# Patient Record
Sex: Male | Born: 1935 | Race: White | Hispanic: No | Smoking: Former smoker
Health system: Southern US, Community
[De-identification: ages and names within clinical notes are randomized; demographics above are authoritative.]

## PROBLEM LIST (undated history)

## (undated) DIAGNOSIS — I10 Essential (primary) hypertension: Secondary | ICD-10-CM

## (undated) DIAGNOSIS — I639 Cerebral infarction, unspecified: Secondary | ICD-10-CM

## (undated) DIAGNOSIS — D509 Iron deficiency anemia, unspecified: Secondary | ICD-10-CM

## (undated) DIAGNOSIS — R1310 Dysphagia, unspecified: Secondary | ICD-10-CM

## (undated) DIAGNOSIS — M6281 Muscle weakness (generalized): Secondary | ICD-10-CM

## (undated) DIAGNOSIS — E7849 Other hyperlipidemia: Secondary | ICD-10-CM

## (undated) HISTORY — PX: PACEMAKER INSERTION: SHX728

## (undated) HISTORY — PX: ESOPHAGEAL DILATION: SHX303

## (undated) HISTORY — PX: HERNIA REPAIR: SHX51

## (undated) HISTORY — PX: CHOLECYSTECTOMY: SHX55

---

## 2004-08-10 ENCOUNTER — Emergency Department: Payer: Self-pay | Admitting: Unknown Physician Specialty

## 2005-02-07 ENCOUNTER — Emergency Department: Payer: Self-pay | Admitting: Emergency Medicine

## 2005-02-08 ENCOUNTER — Other Ambulatory Visit: Payer: Self-pay

## 2005-02-12 ENCOUNTER — Inpatient Hospital Stay: Payer: Self-pay | Admitting: General Surgery

## 2005-04-10 ENCOUNTER — Inpatient Hospital Stay: Payer: Self-pay | Admitting: Otolaryngology

## 2005-04-10 ENCOUNTER — Other Ambulatory Visit: Payer: Self-pay

## 2005-08-20 ENCOUNTER — Ambulatory Visit: Payer: Self-pay | Admitting: General Practice

## 2005-12-01 ENCOUNTER — Ambulatory Visit: Payer: Self-pay | Admitting: Internal Medicine

## 2005-12-29 ENCOUNTER — Ambulatory Visit: Payer: Self-pay | Admitting: General Practice

## 2006-07-12 ENCOUNTER — Ambulatory Visit: Payer: Self-pay | Admitting: Internal Medicine

## 2006-07-15 ENCOUNTER — Ambulatory Visit: Payer: Self-pay | Admitting: Internal Medicine

## 2006-10-13 ENCOUNTER — Ambulatory Visit: Payer: Self-pay | Admitting: Internal Medicine

## 2006-10-18 ENCOUNTER — Ambulatory Visit: Payer: Self-pay | Admitting: Internal Medicine

## 2006-11-13 ENCOUNTER — Ambulatory Visit: Payer: Self-pay | Admitting: Internal Medicine

## 2007-03-13 ENCOUNTER — Ambulatory Visit: Payer: Self-pay | Admitting: Ophthalmology

## 2007-03-21 ENCOUNTER — Ambulatory Visit: Payer: Self-pay | Admitting: Ophthalmology

## 2007-04-14 ENCOUNTER — Ambulatory Visit: Payer: Self-pay | Admitting: Internal Medicine

## 2008-11-04 ENCOUNTER — Ambulatory Visit: Payer: Self-pay | Admitting: Internal Medicine

## 2009-05-20 ENCOUNTER — Ambulatory Visit: Payer: Self-pay | Admitting: Gastroenterology

## 2009-09-01 ENCOUNTER — Ambulatory Visit: Payer: Self-pay | Admitting: Internal Medicine

## 2011-02-16 ENCOUNTER — Emergency Department: Payer: Self-pay | Admitting: *Deleted

## 2011-04-28 ENCOUNTER — Ambulatory Visit: Payer: Self-pay | Admitting: Urology

## 2012-04-24 ENCOUNTER — Ambulatory Visit: Payer: Self-pay | Admitting: Gastroenterology

## 2012-05-23 ENCOUNTER — Ambulatory Visit: Payer: Self-pay | Admitting: Gastroenterology

## 2012-06-12 ENCOUNTER — Ambulatory Visit: Payer: Self-pay | Admitting: Gastroenterology

## 2012-09-27 ENCOUNTER — Observation Stay: Payer: Self-pay | Admitting: Internal Medicine

## 2012-09-27 LAB — BASIC METABOLIC PANEL
Anion Gap: 7 (ref 7–16)
Calcium, Total: 8.6 mg/dL (ref 8.5–10.1)
Chloride: 107 mmol/L (ref 98–107)
EGFR (Non-African Amer.): >60
Potassium: 4 mmol/L (ref 3.5–5.1)
Sodium: 140 mmol/L (ref 136–145)

## 2012-09-27 LAB — CBC
HCT: 42.3 % (ref 40.0–52.0)
MCH: 30.9 pg (ref 26.0–34.0)
MCV: 92 fL (ref 80–100)
Platelet: 265 10*3/uL (ref 150–440)
RBC: 4.61 10*6/uL (ref 4.40–5.90)
RDW: 12.8 % (ref 11.5–14.5)
WBC: 6.8 10*3/uL (ref 3.8–10.6)

## 2012-09-27 LAB — TROPONIN I: Troponin-I: 0.04 ng/mL

## 2012-09-27 LAB — CK TOTAL AND CKMB (NOT AT ARMC)
CK, Total: 125 U/L (ref 35–232)
CK-MB: 2.9 ng/mL (ref 0.5–3.6)

## 2012-09-28 LAB — CBC WITH DIFFERENTIAL/PLATELET
Basophil #: 0 10*3/uL (ref 0.0–0.1)
Basophil %: 0.5 %
Eosinophil #: 0.2 10*3/uL (ref 0.0–0.7)
Lymphocyte #: 2.4 10*3/uL (ref 1.0–3.6)
MCH: 31.4 pg (ref 26.0–34.0)
MCHC: 34.4 g/dL (ref 32.0–36.0)
Monocyte %: 7.8 %
Neutrophil #: 4.1 10*3/uL (ref 1.4–6.5)
Neutrophil %: 56.3 %
Platelet: 239 10*3/uL (ref 150–440)
RBC: 4.46 10*6/uL (ref 4.40–5.90)
RDW: 13.3 % (ref 11.5–14.5)

## 2012-09-28 LAB — BASIC METABOLIC PANEL
Anion Gap: 6 — ABNORMAL LOW (ref 7–16)
BUN: 12 mg/dL (ref 7–18)
Chloride: 109 mmol/L — ABNORMAL HIGH (ref 98–107)
Co2: 27 mmol/L (ref 21–32)
EGFR (African American): >60
Glucose: 93 mg/dL (ref 65–99)
Osmolality: 283 (ref 275–301)
Potassium: 4 mmol/L (ref 3.5–5.1)
Sodium: 142 mmol/L (ref 136–145)

## 2012-09-28 LAB — LIPID PANEL
Cholesterol: 111 mg/dL (ref 0–200)
HDL Cholesterol: 29 mg/dL — ABNORMAL LOW (ref 40–60)
Ldl Cholesterol, Calc: 54 mg/dL (ref 0–100)
Triglycerides: 141 mg/dL (ref 0–200)

## 2012-09-28 LAB — TROPONIN I: Troponin-I: 0.05 ng/mL

## 2012-09-28 LAB — PROTIME-INR
INR: 1
Prothrombin Time: 13.4 secs (ref 11.5–14.7)

## 2012-12-28 ENCOUNTER — Observation Stay: Payer: Self-pay | Admitting: Internal Medicine

## 2012-12-28 LAB — CBC
HCT: 42.3 % (ref 40.0–52.0)
MCH: 31.1 pg (ref 26.0–34.0)
MCHC: 34.3 g/dL (ref 32.0–36.0)
RBC: 4.66 10*6/uL (ref 4.40–5.90)
RDW: 12.8 % (ref 11.5–14.5)
WBC: 8.9 10*3/uL (ref 3.8–10.6)

## 2012-12-28 LAB — URINALYSIS, COMPLETE
Bacteria: NONE SEEN
Bilirubin,UR: NEGATIVE
Blood: NEGATIVE
Glucose,UR: NEGATIVE mg/dL (ref 0–75)
Leukocyte Esterase: NEGATIVE
Nitrite: NEGATIVE
Ph: 6 (ref 4.5–8.0)
Protein: NEGATIVE
RBC,UR: <1 /HPF (ref 0–5)
Squamous Epithelial: NONE SEEN
WBC UR: <1 /HPF (ref 0–5)

## 2012-12-28 LAB — CK TOTAL AND CKMB (NOT AT ARMC)
CK, Total: 136 U/L (ref 35–232)
CK-MB: 2.6 ng/mL (ref 0.5–3.6)

## 2012-12-28 LAB — BASIC METABOLIC PANEL
Anion Gap: 7 (ref 7–16)
Calcium, Total: 9.1 mg/dL (ref 8.5–10.1)
Chloride: 106 mmol/L (ref 98–107)
Co2: 27 mmol/L (ref 21–32)
Creatinine: 1.09 mg/dL (ref 0.60–1.30)
Osmolality: 280 (ref 275–301)
Potassium: 4.1 mmol/L (ref 3.5–5.1)
Sodium: 140 mmol/L (ref 136–145)

## 2012-12-28 LAB — TROPONIN I: Troponin-I: 0.03 ng/mL

## 2012-12-29 LAB — CBC WITH DIFFERENTIAL/PLATELET
Eosinophil %: 4.6 %
HCT: 40 % (ref 40.0–52.0)
HGB: 13.8 g/dL (ref 13.0–18.0)
Lymphocyte #: 2.3 10*3/uL (ref 1.0–3.6)
Lymphocyte %: 31.8 %
MCH: 31.3 pg (ref 26.0–34.0)
MCHC: 34.5 g/dL (ref 32.0–36.0)
Monocyte #: 0.5 x10 3/mm (ref 0.2–1.0)
Neutrophil #: 4 10*3/uL (ref 1.4–6.5)
Neutrophil %: 55.3 %
WBC: 7.2 10*3/uL (ref 3.8–10.6)

## 2012-12-29 LAB — CK TOTAL AND CKMB (NOT AT ARMC)
CK, Total: 93 U/L (ref 35–232)
CK-MB: 2.6 ng/mL (ref 0.5–3.6)
CK-MB: 2.4 ng/mL (ref 0.5–3.6)

## 2012-12-29 LAB — BASIC METABOLIC PANEL
Anion Gap: 5 — ABNORMAL LOW (ref 7–16)
BUN: 11 mg/dL (ref 7–18)
Chloride: 108 mmol/L — ABNORMAL HIGH (ref 98–107)
Creatinine: 0.95 mg/dL (ref 0.60–1.30)
EGFR (Non-African Amer.): >60
Glucose: 92 mg/dL (ref 65–99)
Sodium: 141 mmol/L (ref 136–145)

## 2012-12-29 LAB — LIPID PANEL
HDL Cholesterol: 29 mg/dL — ABNORMAL LOW (ref 40–60)
Triglycerides: 103 mg/dL (ref 0–200)
VLDL Cholesterol, Calc: 21 mg/dL (ref 5–40)

## 2012-12-29 LAB — TSH: Thyroid Stimulating Horm: 1.29 u[IU]/mL

## 2012-12-29 LAB — TROPONIN I: Troponin-I: 0.05 ng/mL

## 2012-12-29 LAB — MAGNESIUM: Magnesium: 1.9 mg/dL

## 2013-06-19 ENCOUNTER — Encounter (HOSPITAL_COMMUNITY): Payer: Self-pay | Admitting: *Deleted

## 2013-06-19 ENCOUNTER — Inpatient Hospital Stay (HOSPITAL_COMMUNITY)
Admission: AD | Admit: 2013-06-19 | Discharge: 2013-06-21 | DRG: 965 | Disposition: A | Discharge status: Rehabilitation Facility | Payer: Medicare Other | Source: Other Acute Inpatient Hospital | Attending: General Surgery | Admitting: General Surgery

## 2013-06-19 ENCOUNTER — Emergency Department: Payer: Self-pay | Admitting: Emergency Medicine

## 2013-06-19 DIAGNOSIS — Z87891 Personal history of nicotine dependence: Secondary | ICD-10-CM

## 2013-06-19 DIAGNOSIS — S066XAA Traumatic subarachnoid hemorrhage with loss of consciousness status unknown, initial encounter: Secondary | ICD-10-CM

## 2013-06-19 DIAGNOSIS — S02109A Fracture of base of skull, unspecified side, initial encounter for closed fracture: Principal | ICD-10-CM | POA: Diagnosis present

## 2013-06-19 DIAGNOSIS — S161XXA Strain of muscle, fascia and tendon at neck level, initial encounter: Secondary | ICD-10-CM

## 2013-06-19 DIAGNOSIS — M25519 Pain in unspecified shoulder: Secondary | ICD-10-CM | POA: Diagnosis present

## 2013-06-19 DIAGNOSIS — S066X9A Traumatic subarachnoid hemorrhage with loss of consciousness of unspecified duration, initial encounter: Secondary | ICD-10-CM

## 2013-06-19 DIAGNOSIS — H9190 Unspecified hearing loss, unspecified ear: Secondary | ICD-10-CM | POA: Diagnosis present

## 2013-06-19 DIAGNOSIS — S32599A Other specified fracture of unspecified pubis, initial encounter for closed fracture: Secondary | ICD-10-CM | POA: Diagnosis present

## 2013-06-19 DIAGNOSIS — S329XXA Fracture of unspecified parts of lumbosacral spine and pelvis, initial encounter for closed fracture: Secondary | ICD-10-CM | POA: Diagnosis present

## 2013-06-19 DIAGNOSIS — I619 Nontraumatic intracerebral hemorrhage, unspecified: Secondary | ICD-10-CM

## 2013-06-19 DIAGNOSIS — S139XXA Sprain of joints and ligaments of unspecified parts of neck, initial encounter: Secondary | ICD-10-CM | POA: Diagnosis present

## 2013-06-19 DIAGNOSIS — S02401A Maxillary fracture, unspecified, initial encounter for closed fracture: Secondary | ICD-10-CM

## 2013-06-19 DIAGNOSIS — S06300A Unspecified focal traumatic brain injury without loss of consciousness, initial encounter: Principal | ICD-10-CM

## 2013-06-19 DIAGNOSIS — M25512 Pain in left shoulder: Secondary | ICD-10-CM | POA: Diagnosis present

## 2013-06-19 DIAGNOSIS — S0280XA Fracture of other specified skull and facial bones, unspecified side, initial encounter for closed fracture: Secondary | ICD-10-CM

## 2013-06-19 DIAGNOSIS — Z8673 Personal history of transient ischemic attack (TIA), and cerebral infarction without residual deficits: Secondary | ICD-10-CM

## 2013-06-19 DIAGNOSIS — I10 Essential (primary) hypertension: Secondary | ICD-10-CM | POA: Diagnosis present

## 2013-06-19 DIAGNOSIS — IMO0001 Reserved for inherently not codable concepts without codable children: Secondary | ICD-10-CM

## 2013-06-19 DIAGNOSIS — S32699A Other specified fracture of unspecified ischium, initial encounter for closed fracture: Secondary | ICD-10-CM

## 2013-06-19 HISTORY — DX: Cerebral infarction, unspecified: I63.9

## 2013-06-19 HISTORY — DX: Essential (primary) hypertension: I10

## 2013-06-19 LAB — CBC
HCT: 36.7 % — ABNORMAL LOW (ref 40.0–52.0)
HGB: 12.1 g/dL — AB (ref 13.0–18.0)
MCH: 28.7 pg (ref 26.0–34.0)
MCHC: 33 g/dL (ref 32.0–36.0)
MCV: 87 fL (ref 80–100)
Platelet: 300 10*3/uL (ref 150–440)
RBC: 4.22 10*6/uL — ABNORMAL LOW (ref 4.40–5.90)
RDW: 14.6 % — ABNORMAL HIGH (ref 11.5–14.5)
WBC: 12.3 10*3/uL — ABNORMAL HIGH (ref 3.8–10.6)

## 2013-06-19 LAB — COMPREHENSIVE METABOLIC PANEL
ALK PHOS: 82 U/L
ALT: 26 U/L (ref 12–78)
AST: 33 U/L (ref 15–37)
Albumin: 3.7 g/dL (ref 3.4–5.0)
Anion Gap: 8 (ref 7–16)
BUN: 10 mg/dL (ref 7–18)
Bilirubin,Total: 0.4 mg/dL (ref 0.2–1.0)
CALCIUM: 8.9 mg/dL (ref 8.5–10.1)
CHLORIDE: 107 mmol/L (ref 98–107)
CO2: 22 mmol/L (ref 21–32)
Creatinine: 0.88 mg/dL (ref 0.60–1.30)
EGFR (African American): >60
EGFR (Non-African Amer.): >60
GLUCOSE: 119 mg/dL — AB (ref 65–99)
Osmolality: 274 (ref 275–301)
POTASSIUM: 3.5 mmol/L (ref 3.5–5.1)
SODIUM: 137 mmol/L (ref 136–145)
Total Protein: 6.4 g/dL (ref 6.4–8.2)

## 2013-06-19 LAB — PROTIME-INR
INR: 1.1
Prothrombin Time: 13.6 secs (ref 11.5–14.7)

## 2013-06-19 LAB — MRSA PCR SCREENING: MRSA BY PCR: NEGATIVE

## 2013-06-19 MED ORDER — ONDANSETRON HCL 4 MG PO TABS: 4.0000 mg | ORAL_TABLET | Freq: Four times a day (QID) | ORAL | Status: DC | PRN

## 2013-06-19 MED ORDER — POTASSIUM CHLORIDE IN NACL 20-0.9 MEQ/L-% IV SOLN
INTRAVENOUS | Status: DC
Start: 2013-06-19 — End: 2013-06-21
  Administered 2013-06-19: 20:00:00 via INTRAVENOUS
  Filled 2013-06-19 (×2): qty 1000

## 2013-06-19 MED ORDER — MORPHINE SULFATE 2 MG/ML IJ SOLN: 2.0000 mg | INTRAMUSCULAR | Status: DC | PRN

## 2013-06-19 MED ORDER — PANTOPRAZOLE SODIUM 40 MG IV SOLR
40.0000 mg | Freq: Every day | INTRAVENOUS | Status: DC
  Filled 2013-06-19 (×2): qty 40

## 2013-06-19 MED ORDER — LISINOPRIL 10 MG PO TABS
10.0000 mg | ORAL_TABLET | Freq: Every day | ORAL | Status: DC
  Administered 2013-06-20: 10 mg via ORAL
  Filled 2013-06-19 (×3): qty 1

## 2013-06-19 MED ORDER — SERTRALINE HCL 25 MG PO TABS
25.0000 mg | ORAL_TABLET | Freq: Every day | ORAL | Status: DC
  Administered 2013-06-19 – 2013-06-21 (×3): 25 mg via ORAL
  Filled 2013-06-19 (×3): qty 1

## 2013-06-19 MED ORDER — ACETAMINOPHEN 325 MG PO TABS
650.0000 mg | ORAL_TABLET | ORAL | Status: DC | PRN
  Administered 2013-06-20: 650 mg via ORAL
  Filled 2013-06-19: qty 2

## 2013-06-19 MED ORDER — OXYCODONE HCL 5 MG PO TABS
5.0000 mg | ORAL_TABLET | ORAL | Status: DC | PRN
  Administered 2013-06-20 – 2013-06-21 (×5): 5 mg via ORAL
  Filled 2013-06-19 (×5): qty 1

## 2013-06-19 MED ORDER — OXYCODONE HCL 5 MG PO TABS: 10.0000 mg | ORAL_TABLET | ORAL | Status: DC | PRN

## 2013-06-19 MED ORDER — DOCUSATE SODIUM 100 MG PO CAPS
100.0000 mg | ORAL_CAPSULE | Freq: Two times a day (BID) | ORAL | Status: DC
  Administered 2013-06-20 – 2013-06-21 (×3): 100 mg via ORAL
  Filled 2013-06-19 (×4): qty 1

## 2013-06-19 MED ORDER — ONDANSETRON HCL 4 MG/2ML IJ SOLN: 4.0000 mg | Freq: Four times a day (QID) | INTRAMUSCULAR | Status: DC | PRN

## 2013-06-19 MED ORDER — PANTOPRAZOLE SODIUM 40 MG PO TBEC
40.0000 mg | DELAYED_RELEASE_TABLET | Freq: Every day | ORAL | Status: DC
  Administered 2013-06-19 – 2013-06-21 (×3): 40 mg via ORAL
  Filled 2013-06-19 (×3): qty 1

## 2013-06-19 MED ORDER — LISINOPRIL 20 MG PO TABS
20.0000 mg | ORAL_TABLET | Freq: Every morning | ORAL | Status: DC
  Administered 2013-06-20 – 2013-06-21 (×2): 20 mg via ORAL
  Filled 2013-06-19 (×2): qty 1
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
## 2013-06-20 ENCOUNTER — Inpatient Hospital Stay (HOSPITAL_COMMUNITY): Payer: Medicare Other

## 2013-06-20 LAB — BASIC METABOLIC PANEL
BUN: 9 mg/dL (ref 6–23)
CHLORIDE: 105 meq/L (ref 96–112)
CO2: 22 meq/L (ref 19–32)
Calcium: 8.3 mg/dL — ABNORMAL LOW (ref 8.4–10.5)
Creatinine, Ser: 0.79 mg/dL (ref 0.50–1.35)
GFR calc Af Amer: >90 mL/min (ref >90)
GFR calc non Af Amer: 80 mL/min — ABNORMAL LOW (ref >90)
GLUCOSE: 136 mg/dL — AB (ref 70–99)
POTASSIUM: 3.9 meq/L (ref 3.7–5.3)
SODIUM: 137 meq/L (ref 137–147)

## 2013-06-20 LAB — CBC
HCT: 34 % — ABNORMAL LOW (ref 39.0–52.0)
HEMOGLOBIN: 11 g/dL — AB (ref 13.0–17.0)
MCH: 29.1 pg (ref 26.0–34.0)
MCHC: 32.4 g/dL (ref 30.0–36.0)
MCV: 89.9 fL (ref 78.0–100.0)
Platelets: 236 10*3/uL (ref 150–400)
RBC: 3.78 MIL/uL — AB (ref 4.22–5.81)
RDW: 13.7 % (ref 11.5–15.5)
WBC: 11.2 10*3/uL — ABNORMAL HIGH (ref 4.0–10.5)

## 2013-06-20 MED FILL — Fentanyl Citrate Inj 0.05 MG/ML: INTRAMUSCULAR | Qty: 2 | Status: AC
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
## 2013-06-20 NOTE — Progress Notes (Signed)
UR completed.  Gabrian Hoque, RN BSN MHA CCM Trauma/Neuro ICU Case Manager [phone]  
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
## 2013-06-20 NOTE — Progress Notes (Signed)
Rehab Admissions Coordinator Note:  Patient was screened by Trish MageLogue, Jeananne Bedwell M for appropriateness for an Inpatient Acute Rehab Consult.  At this time, a rehab consult has been ordered and is pending completion.  Trish MageLogue, Ndeye Tenorio M 06/20/2013, 3:57 PM  I can be reached at [phone].
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
## 2013-06-20 NOTE — Progress Notes (Signed)
Trauma Service Note  Subjective: Patient hard of hearing.  Complaining mostly of left upper arm and shoulder pain.  Objective: Vital signs in last 24 hours: Temp:  [98.4 F (36.9 C)-99 F (37.2 C)] 98.6 F (37 C) (01/07 0807) Pulse Rate:  [39-79] 63 (01/07 0800) Resp:  [12-22] 12 (01/07 0800) BP: (108-151)/(40-78) 125/50 mmHg (01/07 0800) SpO2:  [93 %-99 %] 95 % (01/07 0800) Weight:  [56.9 kg (125 lb 7.1 oz)] 56.9 kg (125 lb 7.1 oz) (01/06 1800)    Intake/Output from previous day: 01/06 0701 - 01/07 0700 In: 840 [P.O.:240; I.V.:600] Out: 620 [Urine:620] Intake/Output this shift: Total I/O In: 50 [I.V.:50] Out: -   General: No acute distress, complaining of left arm pain.  Lungs: Clear  Abd: Soft, good bowel sounds, nontender  Extremities: Limited ROM of the left arm and shoulder, severe pain in the shoulder joint.    Neuro: Intact  Lab Results: CBC   Recent Labs  06/20/13 0245  WBC 11.2*  HGB 11.0*  HCT 34.0*  PLT 236   BMET  Recent Labs  06/20/13 0245  NA 137  K 3.9  CL 105  CO2 22  GLUCOSE 136*  BUN 9  CREATININE 0.79  CALCIUM 8.3*   PT/INR No results found for this basename: LABPROT, INR,  in the last 72 hours ABG No results found for this basename: PHART, PCO2, PO2, HCO3,  in the last 72 hours  Studies/Results: No results found.  Anti-infectives: Anti-infectives   None      Assessment/Plan: s/p  Advance diet Transfer to floor X-ray LUE if not already done. Physical therapy to start.  LOS: 1 day   Marta LamasJames O. Gae BonWyatt, III, MD, FACS [phone] Trauma Surgeon 06/20/2013
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
## 2013-06-20 NOTE — Progress Notes (Signed)
Physical medicine and rehabilitation consult requested in chart reviewed. X-rays and imaging pending with further workup ongoing. Physical and occupational therapy evaluation notes to be reviewed. Will followup once formal x-rays and results are made available
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
## 2013-06-21 ENCOUNTER — Inpatient Hospital Stay (HOSPITAL_COMMUNITY)
Admission: RE | Admit: 2013-06-21 | Discharge: 2013-07-03 | DRG: 945 | Disposition: A | Discharge status: Skilled Nursing Facility | Payer: Medicare Other | Source: Intra-hospital | Attending: Physical Medicine & Rehabilitation | Admitting: Physical Medicine & Rehabilitation

## 2013-06-21 DIAGNOSIS — F3289 Other specified depressive episodes: Secondary | ICD-10-CM | POA: Diagnosis present

## 2013-06-21 DIAGNOSIS — Z5189 Encounter for other specified aftercare: Principal | ICD-10-CM

## 2013-06-21 DIAGNOSIS — H9190 Unspecified hearing loss, unspecified ear: Secondary | ICD-10-CM | POA: Diagnosis present

## 2013-06-21 DIAGNOSIS — S32599A Other specified fracture of unspecified pubis, initial encounter for closed fracture: Secondary | ICD-10-CM | POA: Diagnosis present

## 2013-06-21 DIAGNOSIS — S32699A Other specified fracture of unspecified ischium, initial encounter for closed fracture: Secondary | ICD-10-CM

## 2013-06-21 DIAGNOSIS — S069X9A Unspecified intracranial injury with loss of consciousness of unspecified duration, initial encounter: Secondary | ICD-10-CM

## 2013-06-21 DIAGNOSIS — S069XAA Unspecified intracranial injury with loss of consciousness status unknown, initial encounter: Secondary | ICD-10-CM

## 2013-06-21 DIAGNOSIS — S43429A Sprain of unspecified rotator cuff capsule, initial encounter: Secondary | ICD-10-CM | POA: Diagnosis present

## 2013-06-21 DIAGNOSIS — S32509A Unspecified fracture of unspecified pubis, initial encounter for closed fracture: Secondary | ICD-10-CM | POA: Diagnosis present

## 2013-06-21 DIAGNOSIS — Z87891 Personal history of nicotine dependence: Secondary | ICD-10-CM

## 2013-06-21 DIAGNOSIS — F329 Major depressive disorder, single episode, unspecified: Secondary | ICD-10-CM | POA: Diagnosis present

## 2013-06-21 DIAGNOSIS — S161XXS Strain of muscle, fascia and tendon at neck level, sequela: Secondary | ICD-10-CM

## 2013-06-21 DIAGNOSIS — M25512 Pain in left shoulder: Secondary | ICD-10-CM | POA: Diagnosis present

## 2013-06-21 DIAGNOSIS — Y92009 Unspecified place in unspecified non-institutional (private) residence as the place of occurrence of the external cause: Secondary | ICD-10-CM

## 2013-06-21 DIAGNOSIS — I10 Essential (primary) hypertension: Secondary | ICD-10-CM | POA: Diagnosis present

## 2013-06-21 DIAGNOSIS — S06300A Unspecified focal traumatic brain injury without loss of consciousness, initial encounter: Secondary | ICD-10-CM | POA: Diagnosis present

## 2013-06-21 DIAGNOSIS — S161XXA Strain of muscle, fascia and tendon at neck level, initial encounter: Secondary | ICD-10-CM

## 2013-06-21 DIAGNOSIS — N400 Enlarged prostate without lower urinary tract symptoms: Secondary | ICD-10-CM | POA: Diagnosis present

## 2013-06-21 DIAGNOSIS — S02109A Fracture of base of skull, unspecified side, initial encounter for closed fracture: Secondary | ICD-10-CM | POA: Diagnosis present

## 2013-06-21 DIAGNOSIS — R339 Retention of urine, unspecified: Secondary | ICD-10-CM

## 2013-06-21 DIAGNOSIS — S069X0S Unspecified intracranial injury without loss of consciousness, sequela: Secondary | ICD-10-CM

## 2013-06-21 DIAGNOSIS — S32609A Unspecified fracture of unspecified ischium, initial encounter for closed fracture: Secondary | ICD-10-CM | POA: Diagnosis present

## 2013-06-21 DIAGNOSIS — Z8673 Personal history of transient ischemic attack (TIA), and cerebral infarction without residual deficits: Secondary | ICD-10-CM

## 2013-06-21 MED ORDER — BETHANECHOL CHLORIDE 10 MG PO TABS
10.0000 mg | ORAL_TABLET | Freq: Three times a day (TID) | ORAL | Status: DC
  Filled 2013-06-21 (×3): qty 1

## 2013-06-21 MED ORDER — DOCUSATE SODIUM 100 MG PO CAPS
100.0000 mg | ORAL_CAPSULE | Freq: Two times a day (BID) | ORAL | Status: DC
  Administered 2013-06-21 – 2013-07-03 (×24): 100 mg via ORAL
  Filled 2013-06-21 (×26): qty 1

## 2013-06-21 MED ORDER — TAMSULOSIN HCL 0.4 MG PO CAPS
0.4000 mg | ORAL_CAPSULE | Freq: Every day | ORAL | Status: DC
  Administered 2013-06-22 – 2013-07-02 (×11): 0.4 mg via ORAL
  Filled 2013-06-21 (×13): qty 1

## 2013-06-21 MED ORDER — LISINOPRIL 20 MG PO TABS
20.0000 mg | ORAL_TABLET | Freq: Every morning | ORAL | Status: DC
  Administered 2013-06-22 – 2013-07-01 (×9): 20 mg via ORAL
  Filled 2013-06-21 (×11): qty 1

## 2013-06-21 MED ORDER — SORBITOL 70 % SOLN: 30.0000 mL | Freq: Every day | Status: DC | PRN

## 2013-06-21 MED ORDER — ONDANSETRON HCL 4 MG/2ML IJ SOLN: 4.0000 mg | Freq: Four times a day (QID) | INTRAMUSCULAR | Status: DC | PRN

## 2013-06-21 MED ORDER — PANTOPRAZOLE SODIUM 40 MG PO TBEC
40.0000 mg | DELAYED_RELEASE_TABLET | Freq: Every day | ORAL | Status: DC
  Administered 2013-06-22 – 2013-07-03 (×12): 40 mg via ORAL
  Filled 2013-06-21 (×12): qty 1

## 2013-06-21 MED ORDER — SODIUM CHLORIDE 0.9 % IJ SOLN: 3.0000 mL | INTRAMUSCULAR | Status: DC | PRN

## 2013-06-21 MED ORDER — OXYCODONE HCL 5 MG PO TABS
5.0000 mg | ORAL_TABLET | ORAL | Status: DC | PRN
  Administered 2013-06-21 – 2013-07-02 (×15): 5 mg via ORAL
  Filled 2013-06-21 (×15): qty 1

## 2013-06-21 MED ORDER — ACETAMINOPHEN 325 MG PO TABS
650.0000 mg | ORAL_TABLET | ORAL | Status: DC | PRN
  Administered 2013-06-22 – 2013-07-02 (×4): 650 mg via ORAL
  Filled 2013-06-21 (×4): qty 2

## 2013-06-21 MED ORDER — ONDANSETRON HCL 4 MG PO TABS: 4.0000 mg | ORAL_TABLET | Freq: Four times a day (QID) | ORAL | Status: DC | PRN

## 2013-06-21 MED ORDER — SERTRALINE HCL 25 MG PO TABS
25.0000 mg | ORAL_TABLET | Freq: Every day | ORAL | Status: DC
  Administered 2013-06-22 – 2013-07-03 (×12): 25 mg via ORAL
  Filled 2013-06-21 (×13): qty 1

## 2013-06-21 MED ORDER — TAMSULOSIN HCL 0.4 MG PO CAPS
0.4000 mg | ORAL_CAPSULE | Freq: Every day | ORAL | Status: DC
  Filled 2013-06-21: qty 1

## 2013-06-21 MED ORDER — LISINOPRIL 10 MG PO TABS
10.0000 mg | ORAL_TABLET | Freq: Every day | ORAL | Status: DC
  Administered 2013-06-21 – 2013-07-02 (×12): 10 mg via ORAL
  Filled 2013-06-21 (×14): qty 1
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
## 2013-06-21 NOTE — Progress Notes (Signed)
Trauma Service Note  Subjective: Patient still complaining of left shoulder pain.  Sitting up in chair.  Has foley in place.  Objective: Vital signs in last 24 hours: Temp:  [97.9 F (36.6 C)-99.3 F (37.4 C)] 98.3 F (36.8 C) (01/08 0913) Pulse Rate:  [60-65] 65 (01/08 0913) Resp:  [20-23] 20 (01/08 0913) BP: (115-141)/(46-61) 115/49 mmHg (01/08 0913) SpO2:  [95 %-98 %] 97 % (01/08 0913) Last BM Date: 06/20/13  Intake/Output from previous day: 01/07 0701 - 01/08 0700 In: 1420 [P.O.:1310; I.V.:110] Out: 775 [Urine:775] Intake/Output this shift:    General: No acute distress, but grimaces when his left shoulder is moved.  Lungs: Clear  Abd: Benign  Extremities: Restricted ROM of left shoulder.    Neuro: Intact  Lab Results: CBC   Recent Labs  06/20/13 0245  WBC 11.2*  HGB 11.0*  HCT 34.0*  PLT 236   BMET  Recent Labs  06/20/13 0245  NA 137  K 3.9  CL 105  CO2 22  GLUCOSE 136*  BUN 9  CREATININE 0.79  CALCIUM 8.3*   PT/INR No results found for this basename: LABPROT, INR,  in the last 72 hours ABG No results found for this basename: PHART, PCO2, PO2, HCO3,  in the last 72 hours  Studies/Results: Dg Pelvis 1-2 Views  06/20/2013   CLINICAL DATA:  Pain  EXAM: PELVIS - 1-2 VIEW  COMPARISON:  None.  FINDINGS: There is generalized osteopenia. There is no hip fracture or dislocation. There is a nondisplaced fracture of the right superior pubic ramus. The right inferior pubic ramus fracture is suboptimally visualized. The fracture of the right ischial tuberosity is suboptimally visualized.  There is lower lumbar spine spondylosis.  IMPRESSION: Nondisplaced fracture of the right superior pubic ramus. The right inferior pubic ramus fracture and right ischial tuberosity fracture is not well delineated on this exam.   Electronically Signed   By: Elige KoHetal  Patel   On: 06/20/2013 12:27   Dg Shoulder 1v Left  06/20/2013   CLINICAL DATA:  Left shoulder pain with limited  range of motion  EXAM: LEFT SHOULDER - 1 VIEW  COMPARISON:  None.  FINDINGS: Axillary view of the left shoulder demonstrates no fracture or dislocation.  IMPRESSION: No left shoulder dislocation.   Electronically Signed   By: Elige KoHetal  Patel   On: 06/20/2013 16:24   Dg Shoulder Left  06/20/2013   CLINICAL DATA:  pain and tenderness left sholder  EXAM: LEFT SHOULDER - 2+ VIEW  COMPARISON:  None.  FINDINGS: There is no fracture or dislocation. There are moderate degenerative changes of the acromioclavicular joint.  IMPRESSION: No acute osseous injury of the left shoulder.   Electronically Signed   By: Elige KoHetal  Patel   On: 06/20/2013 12:22   Dg Humerus Left  06/20/2013   CLINICAL DATA:  LUE pain and tenderness  EXAM: LEFT HUMERUS - 2+ VIEW  COMPARISON:  None.  FINDINGS: There is no acute fracture or dislocation. There is calcification adjacent to the lateral epicondyle and lateral radial head likely representing sequela of prior epicondylitis.  IMPRESSION: No acute osseous injury of the left humerus.   Electronically Signed   By: Elige KoHetal  Patel   On: 06/20/2013 12:24    Anti-infectives: Anti-infectives   None      Assessment/Plan: s/p  Advance diet Continue to mobilize with PT Try to see if we can g et the foley out. May need Flomax and/or Urecholine.  LOS: 2 days   Marta LamasJames O.  Gae Bon, MD, FACS [phone] Trauma Surgeon 06/21/2013
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
## 2013-06-21 NOTE — Clinical Social Work Note (Signed)
Clinical Social Worker continuing to follow patient and family for support and discharge planning needs.  CSW spoke with inpatient rehab admissions coordinator who states that patient will discharge to inpatient rehab today and family has been updated.    Clinical Social Worker will sign off for now as social work intervention is no longer needed. Please consult us again if new need arises.  Macario GoldsJesse Sheyann Sulton, [place] [phone]
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
## 2013-06-21 NOTE — Progress Notes (Signed)
Rehab admissions - Evaluated for possible admission.  I met with patient and his daughter.  I gave them rehab booklets and discussed inpatient rehab.  I answered questions.  Dtr is interested in inpatient rehab.  Dtr and son plan to provide supervision for patient in his home after rehab.  I spoke with Dr. Hulen Skains and he says patient is okay for inpatient rehab admission for today.  Call me for questions.  #[phone]
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
## 2013-06-21 NOTE — Progress Notes (Signed)
Order to d/c foley. Currently patient does not have a foley catheter. He has a condom catheter for comfort as he has some urgency when needing to go to the bathroom and has difficulty holding the urinal with his left shoulder injury.  Patient requested to leave condom catheter on for ease and comfort. He is okay with removing condom cath once he has more rehabilitation and can use a urinal without problems. Will continue to monitor and assess readiness to remove condom cath.

## 2013-06-22 ENCOUNTER — Inpatient Hospital Stay (HOSPITAL_COMMUNITY): Payer: Medicare Other | Admitting: Occupational Therapy

## 2013-06-22 ENCOUNTER — Inpatient Hospital Stay (HOSPITAL_COMMUNITY): Payer: Medicare Other | Admitting: *Deleted

## 2013-06-22 ENCOUNTER — Inpatient Hospital Stay (HOSPITAL_COMMUNITY): Payer: Medicare Other | Admitting: Speech Pathology

## 2013-06-22 DIAGNOSIS — S069X9A Unspecified intracranial injury with loss of consciousness of unspecified duration, initial encounter: Secondary | ICD-10-CM

## 2013-06-22 DIAGNOSIS — S069XAA Unspecified intracranial injury with loss of consciousness status unknown, initial encounter: Secondary | ICD-10-CM

## 2013-06-22 LAB — CBC WITH DIFFERENTIAL/PLATELET
Basophils Absolute: 0 10*3/uL (ref 0.0–0.1)
Basophils Relative: 0 % (ref 0–1)
Eosinophils Absolute: 0.2 10*3/uL (ref 0.0–0.7)
Eosinophils Relative: 2 % (ref 0–5)
HEMATOCRIT: 31.7 % — AB (ref 39.0–52.0)
Hemoglobin: 10.4 g/dL — ABNORMAL LOW (ref 13.0–17.0)
LYMPHS ABS: 1.3 10*3/uL (ref 0.7–4.0)
LYMPHS PCT: 13 % (ref 12–46)
MCH: 29.5 pg (ref 26.0–34.0)
MCHC: 32.8 g/dL (ref 30.0–36.0)
MCV: 89.8 fL (ref 78.0–100.0)
Monocytes Absolute: 1.3 10*3/uL — ABNORMAL HIGH (ref 0.1–1.0)
Monocytes Relative: 13 % — ABNORMAL HIGH (ref 3–12)
Neutro Abs: 7.3 10*3/uL (ref 1.7–7.7)
Neutrophils Relative %: 72 % (ref 43–77)
PLATELETS: 212 10*3/uL (ref 150–400)
RBC: 3.53 MIL/uL — AB (ref 4.22–5.81)
RDW: 14.2 % (ref 11.5–15.5)
WBC: 10.1 10*3/uL (ref 4.0–10.5)

## 2013-06-22 LAB — COMPREHENSIVE METABOLIC PANEL
ALT: 11 U/L (ref 0–53)
AST: 16 U/L (ref 0–37)
Albumin: 2.7 g/dL — ABNORMAL LOW (ref 3.5–5.2)
Alkaline Phosphatase: 55 U/L (ref 39–117)
BILIRUBIN TOTAL: 0.6 mg/dL (ref 0.3–1.2)
BUN: 9 mg/dL (ref 6–23)
CALCIUM: 8.1 mg/dL — AB (ref 8.4–10.5)
CHLORIDE: 99 meq/L (ref 96–112)
CO2: 23 meq/L (ref 19–32)
Creatinine, Ser: 0.87 mg/dL (ref 0.50–1.35)
GFR, EST AFRICAN AMERICAN: 89 mL/min — AB (ref >90)
GFR, EST NON AFRICAN AMERICAN: 77 mL/min — AB (ref >90)
GLUCOSE: 115 mg/dL — AB (ref 70–99)
Potassium: 3.3 mEq/L — ABNORMAL LOW (ref 3.7–5.3)
SODIUM: 134 meq/L — AB (ref 137–147)
Total Protein: 5.3 g/dL — ABNORMAL LOW (ref 6.0–8.3)

## 2013-06-22 MED ORDER — DICLOFENAC SODIUM 1 % TD GEL
4.0000 g | Freq: Four times a day (QID) | TRANSDERMAL | Status: DC
  Administered 2013-06-22 – 2013-07-03 (×46): 4 g via TOPICAL
  Filled 2013-06-22 (×2): qty 100

## 2013-06-22 MED ORDER — POTASSIUM CHLORIDE CRYS ER 20 MEQ PO TBCR
20.0000 meq | EXTENDED_RELEASE_TABLET | Freq: Every day | ORAL | Status: AC
  Administered 2013-06-22 – 2013-06-24 (×3): 20 meq via ORAL
  Filled 2013-06-22 (×3): qty 1
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
## 2013-06-22 NOTE — Progress Notes (Signed)
Physical Therapy Session Note  Patient Details  Name: Nathaniel Fisher MRN: [id number] Date of Birth: December 20, 1925  Today's Date: 06/22/2013 Time: 1430-1500 Time Calculation (min): 30 min  Short Term Goals: Week 1:  PT Short Term Goal 1 (Week 1): Patient will perform sit<>supine with modA with HOB flat. PT Short Term Goal 2 (Week 1): Patient will perform bed<>wheelchair transfers with modA. PT Short Term Goal 3 (Week 1): Patient will perform gait training with LRAD x25' with maxA.  Skilled Therapeutic Interventions/Progress Updates:    Patient received sitting in wheelchair. Session focused on functional transfer training wheelchair>bed and sit>supine with maxA. Patient limited by pain throughout session. Patient's daughter present and initiated discussion regarding discharge planning, recommendations for 24/7 assist and supervision, goals, and ELOS. Patient left supine in bed with all needs within reach and bed alarm on.  Therapy Documentation Precautions:  Precautions Precautions: Fall Precaution Comments: HOH, very painful LUE with limited AROM Restrictions Weight Bearing Restrictions: Yes RLE Weight Bearing: Weight bearing as tolerated LLE Weight Bearing: Weight bearing as tolerated Pain: Pain Assessment Pain Assessment: 0-10 Pain Score: 5  Pain Type: Acute pain Pain Location: Shoulder Pain Orientation: Left Pain Descriptors / Indicators: Aching;Sore Pain Onset: With Activity Pain Intervention(s): RN made aware;Ambulation/increased activity;Repositioned Multiple Pain Sites: No  See FIM for current functional status  Therapy/Group: Individual Therapy  Chipper HerbBridget S Verlena Marlette S. Erastus Bartolomei, PT, DPT 06/22/2013, 3:56 PM

## 2013-06-22 NOTE — Progress Notes (Signed)
Orthopedic Tech Progress Note Patient Details:  Nathaniel HasteWillard Fisher 05-02-1926 [id number] Arm sling applied to Left UE. Application tolerated well.  Ortho Devices Type of Ortho Device: Arm sling Ortho Device/Splint Interventions: Application   Asia R Monier 06/22/2013, 11:51 AM
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
## 2013-06-22 NOTE — Discharge Summary (Signed)
Patient appropriate for CIR.  This patient has been seen and I agree with the findings and treatment plan.  Marta LamasJames O. Gae BonWyatt, III, MD, FACS [phone] (pager) [phone] (direct pager) Trauma Surgeon
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
## 2013-06-22 NOTE — Progress Notes (Signed)
Occupational Therapy Session Note  Patient Details  Name: Nathaniel Fisher MRN: [id number] Date of Birth: 1925/11/19  Today's Date: 06/22/2013 Time: 1030-1130 Time Calculation (min): 60 min  Skilled Therapeutic Interventions/Progress Updates:  Patient found seated in w/c upon entering room with SW and daughter present. Therapist propelled patient from room > ortho gym. Patient very HOH. While in ortho gym therapist engaged patient in BUE AROM, PROM, and strength testing. LUE pain > RUE pain; see OT eval for more information and details regarding BUE strength and sensation testing. Patient with increased agitation during session secondary to pain and decreased cognitive status. After testing, patient stated he needed to use bathroom. Therapist propelled patient from ortho gym back to room > bathroom. Patient stood using grab bars and with max assist (+2 for safety during transfer > toilet). Patient with small BM, therapist assisted with peri care. Patient then transferred back to w/c with max assist X1 using grab bars. Patient was total assist-total assist X2 for toileting needs. At end of session, left patient seated in w/c with daughter present.   Precautions:  Precautions Precautions: Fall Precaution Comments: very painful LUE with limited AROM Restrictions Weight Bearing Restrictions: Yes RLE Weight Bearing: Weight bearing as tolerated LLE Weight Bearing: Weight bearing as tolerated  Vital Signs: Therapy Vitals Pulse Rate: 58 BP: 129/6 mmHg Patient Position, if appropriate: Sitting Oxygen Therapy SpO2: 95 % O2 Device: None (Room air) Pulse Oximetry Type: Intermittent  See FIM for current functional status  Meliana Canner 06/22/2013, 11:47 AM
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
## 2013-06-23 ENCOUNTER — Inpatient Hospital Stay (HOSPITAL_COMMUNITY): Payer: Medicare Other | Admitting: *Deleted

## 2013-06-23 DIAGNOSIS — IMO0002 Reserved for concepts with insufficient information to code with codable children: Secondary | ICD-10-CM

## 2013-06-23 DIAGNOSIS — S069XAS Unspecified intracranial injury with loss of consciousness status unknown, sequela: Secondary | ICD-10-CM

## 2013-06-23 DIAGNOSIS — S069X9S Unspecified intracranial injury with loss of consciousness of unspecified duration, sequela: Secondary | ICD-10-CM

## 2013-06-23 NOTE — Progress Notes (Signed)
Occupational Therapy Note  Patient Details  Name: Nathaniel Fisher MRN: [id number] Date of Birth: 12/16/1925 Today's Date: 06/23/2013  Time:1400-1500  (60 min) Pain:  Left shoulder  (4/10 with activity) Individual session  Pt. Sitting in recliner upon OT arrival.  Daughter, Lucendia HerrlichFaye, present.  Positioned pt at sink and addressed sitting unsupported, sit to stand and standing tolerance.  Pt. Went to unsupported sitting with mod assist.  Was able to maintain for 3 seconds before going back to supported sitting.  Pt. Shaved self with max assist.  He was mildly irritated at first with having to do an activity but then seemed glad at end that he did it.  Went from sit to stand with max assit and stood statically for 1 minute.  Unable to take any steps with +2 present.  Transferred to bed from recliner with mod assist to minimal assist.  Left in bed with call bell in hand and daughter present.     Humberto Sealsdwards, Lachelle Rissler J 06/23/2013, 6:23 PM
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
## 2013-06-23 NOTE — Progress Notes (Signed)
Wyatt HasteWillard Zahner is a 80 y.o. male May 31, 1926 [id number]  Subjective: No new complaints. No new problems. Slept well. Feeling OK.  Objective: Vital signs in last 24 hours: Temp:  [98.2 F (36.8 C)-98.3 F (36.8 C)] 98.2 F (36.8 C) (01/10 0629) Pulse Rate:  [58-82] 61 (01/10 0629) Resp:  [18] 18 (01/10 0629) BP: (117-132)/(6-68) 117/66 mmHg (01/10 0629) SpO2:  [95 %-96 %] 96 % (01/10 0629) Weight change:  Last BM Date: 06/20/13  Intake/Output from previous day: 01/09 0701 - 01/10 0700 In: 480 [P.O.:480] Out: 600 [Urine:600] Last cbgs: CBG (last 3)  No results found for this basename: GLUCAP,  in the last 72 hours   Physical Exam General: No apparent distress    HEENT: moist mucosa Lungs: Normal effort. Lungs clear to auscultation, no crackles or wheezes. Cardiovascular: Regular rate and rhythm, no edema Abdomen: S/NT/ND; BS(+) Musculoskeletal:  No change from before Neurological: No new neurological deficits Wounds: N/A    Skin: facial abrasions are healing Alert, cooperative   Lab Results: BMET    Component Value Date/Time   NA 134* 06/22/2013 0650   K 3.3* 06/22/2013 0650   CL 99 06/22/2013 0650   CO2 23 06/22/2013 0650   GLUCOSE 115* 06/22/2013 0650   BUN 9 06/22/2013 0650   CREATININE 0.87 06/22/2013 0650   CALCIUM 8.1* 06/22/2013 0650   GFRNONAA 77* 06/22/2013 0650   GFRAA 89* 06/22/2013 0650   CBC    Component Value Date/Time   WBC 10.1 06/22/2013 0650   RBC 3.53* 06/22/2013 0650   HGB 10.4* 06/22/2013 0650   HCT 31.7* 06/22/2013 0650   PLT 212 06/22/2013 0650   MCV 89.8 06/22/2013 0650   MCH 29.5 06/22/2013 0650   MCHC 32.8 06/22/2013 0650   RDW 14.2 06/22/2013 0650   LYMPHSABS 1.3 06/22/2013 0650   MONOABS 1.3* 06/22/2013 0650   EOSABS 0.2 06/22/2013 0650   BASOSABS 0.0 06/22/2013 0650    Studies/Results: No results found.  Medications: I have reviewed the patient's current medications.  Assessment/Plan:  1. TBI/left intraventricular hemorrhage/ right pubic ramus and Ischial  fracture--WBAT. Likely left rotator cuff tear.  2.. DVT Prophylaxis/Anticoagulation: SCD's.Monitor for any signs of DVT  3. Pain Management: Oxycodone as needed. Monitor with increased mobility  -add voltaren gel for left shoulder---consider steroid injection as well  -sling may be useful for pain control also  4. Mood/depression.Zoloft 25mg  daily Provide emotional support.  5. Neuropsych: This patient is not capable of making decisions on His own behalf.  6.Left maxillary sinus fracture. Conservative care  7.Hypertension.Lisinopril 20 mg every a.m. and 10 mg each bedtime.  8.BPH.Flomax 0.4mg  QHS. Check PVRs x3      Length of stay, days: 2  Sonda PrimesAlex Izabell Schalk , MD 06/23/2013, 9:04 AM

## 2013-06-23 NOTE — Plan of Care (Deleted)
Problem: RH PAIN MANAGEMENT Goal: RH STG PAIN MANAGED AT OR BELOW PT'S PAIN GOAL Pain Level 3 or less on a scale of 0-10.  Outcome: Not Progressing Complains of chronic pain- continues to rate 10 out of 10

## 2013-06-24 ENCOUNTER — Inpatient Hospital Stay (HOSPITAL_COMMUNITY): Payer: Medicare Other | Admitting: Physical Therapy

## 2013-06-24 ENCOUNTER — Inpatient Hospital Stay (HOSPITAL_COMMUNITY): Payer: Medicare Other | Admitting: *Deleted

## 2013-06-24 DIAGNOSIS — S32509A Unspecified fracture of unspecified pubis, initial encounter for closed fracture: Secondary | ICD-10-CM

## 2013-06-24 MED ORDER — PNEUMOCOCCAL VAC POLYVALENT 25 MCG/0.5ML IJ INJ
0.5000 mL | INJECTION | INTRAMUSCULAR | Status: AC
  Administered 2013-06-25: 0.5 mL via INTRAMUSCULAR
  Filled 2013-06-24: qty 0.5

## 2013-06-24 NOTE — Progress Notes (Signed)
Nathaniel Fisher is a 80 y.o. male February 02, 1926 [id number]  Subjective: No new complaints. No new problems. Slept well. Feeling OK.  Objective: Vital signs in last 24 hours: Temp:  [98.5 F (36.9 C)-98.9 F (37.2 C)] 98.5 F (36.9 C) (01/11 0618) Pulse Rate:  [63-74] 67 (01/11 0618) Resp:  [18-19] 18 (01/11 0618) BP: (110-133)/(67-79) 133/79 mmHg (01/11 0618) SpO2:  [95 %-98 %] 95 % (01/11 0618) Weight change:  Last BM Date: 06/22/13  Intake/Output from previous day: 01/10 0701 - 01/11 0700 In: 720 [P.O.:720] Out: 1100 [Urine:1100] Last cbgs: CBG (last 3)  No results found for this basename: GLUCAP,  in the last 72 hours   Physical Exam General: No apparent distress    HEENT: moist mucosa Lungs: Normal effort. Lungs clear to auscultation, no crackles or wheezes. Cardiovascular: Regular rate and rhythm, no edema Abdomen: S/NT/ND; BS(+) Musculoskeletal:  No change from before Neurological: No new neurological deficits Wounds: N/A    Skin: facial abrasions are healing Alert, cooperative   Lab Results: BMET    Component Value Date/Time   NA 134* 06/22/2013 0650   K 3.3* 06/22/2013 0650   CL 99 06/22/2013 0650   CO2 23 06/22/2013 0650   GLUCOSE 115* 06/22/2013 0650   BUN 9 06/22/2013 0650   CREATININE 0.87 06/22/2013 0650   CALCIUM 8.1* 06/22/2013 0650   GFRNONAA 77* 06/22/2013 0650   GFRAA 89* 06/22/2013 0650   CBC    Component Value Date/Time   WBC 10.1 06/22/2013 0650   RBC 3.53* 06/22/2013 0650   HGB 10.4* 06/22/2013 0650   HCT 31.7* 06/22/2013 0650   PLT 212 06/22/2013 0650   MCV 89.8 06/22/2013 0650   MCH 29.5 06/22/2013 0650   MCHC 32.8 06/22/2013 0650   RDW 14.2 06/22/2013 0650   LYMPHSABS 1.3 06/22/2013 0650   MONOABS 1.3* 06/22/2013 0650   EOSABS 0.2 06/22/2013 0650   BASOSABS 0.0 06/22/2013 0650    Studies/Results: No results found.  Medications: I have reviewed the patient's current medications.  Assessment/Plan:  1. TBI/left intraventricular hemorrhage/ right pubic ramus and  Ischial fracture--WBAT. Likely left rotator cuff tear.  2.. DVT Prophylaxis/Anticoagulation: SCD's.Monitor for any signs of DVT  3. Pain Management: Oxycodone as needed. Monitor with increased mobility  -add voltaren gel for left shoulder---consider steroid injection as well  -sling may be useful for pain control also  4. Mood/depression.Zoloft 25mg  daily Provide emotional support.  5. Neuropsych: This patient is not capable of making decisions on His own behalf.  6.Left maxillary sinus fracture. Conservative care  7.Hypertension.Lisinopril 20 mg every a.m. and 10 mg each bedtime.  8.BPH.Flomax 0.4mg  QHS. Check PVRs x3      Length of stay, days: 3  Sonda PrimesAlex Plotnikov , MD 06/24/2013, 9:42 AM

## 2013-06-24 NOTE — Progress Notes (Signed)
Physical Therapy Note  Patient Details  Name: Nathaniel Fisher MRN: [id number] Date of Birth: 1926-04-18 Today's Date: 06/24/2013  Time 1: 748-842 54 minutes  1:1 Pt c/o pain in groin and LEs with mobility, RN made aware.  Pt requires encouragement and increased time to participate, but willing to participate in PT session.  Session focused on sit to stand and gait training.  Pt able to perform sit <> stand with min-mod A, cues for UE placement.  Pt able to gait 3 x 15' with RW with min A.  Pt with wide BOS, short steps with decreased stance time on L LE due to pain.  Pt with improved mobility this session, continues to require encouragement and is limited by pain.  Time 2: 1300-1330 30 minutes Pt refused final 15 minutes of session due to pain, fatigue.  1:1 Pt c/o groin pain and pain "all over", pt declines pain meds at this time.  Pt required max encouragement to participate in this session, agreeable to seated activity only.  Therex for B LEs with AAROM on R LE due to pain, AROM on L LE.  Pt able to perform 2 x 10 LAQ, hip flexion, AP, HS curls, hip add isometrics.  W/c mobility training with B LEs with pt able to perform with min A, cues for sequencing.  Pt c/o groin pain after w/c mobility but able to propel 50' with 3 seated rests.  Pt encouraged to continue PT session but reports "I'm tired and I'm going to rest".  Pt left in room with safety belt in place.   Topaz Raglin 06/24/2013, 8:41 AM
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
## 2013-06-24 NOTE — Progress Notes (Signed)
Occupational Therapy Note   Patient Details  Name: Nathaniel Fisher MRN: [id number] Date of Birth: July 06, 1925 Today's Date: 06/24/2013  Time:  1030-1130  (60 min)  1st session Pain:  3/10 all over Individual session  1st session:  Engaged in functional mobility, transfers, sit to stand, standing balance and AROM of RUE and BLE.  Pt sitting in wc upon OT arrival.  Refused to get bathed and dressed.  Performed ROM to extremities except LUE which was painful.  .  Used incentive spirometer x25 breaths at around 700 ml.    Pt went from sit to stand with max assist.  STood for 2 minutes with min assist (static).  Ambulated with RW to toilet (Mod assist plus increased time)  Needed verbal cues to keep walker close.  Transferred to regular toilet.  Had BM.  OT did pericare.  Ambulated back out to wc.  Left pt in wc with safety belt on and call bell,phone within reach.    Time:  1445-1515   (30 min)  2nd session Pain:  4/10 all over Individual session   2nd session:  Family present during session. Pt sittiing in wc upon OT arrival.  Ambulated about 18 feet around room with mod to min assist plus increased time.  Pt. Used spirometer x 10 with slight irritation that he was asked to do more exercises.    Left pt in wc with safety belt on and call bell,phone within reach.       Humberto Sealsdwards, Stryker Veasey J 06/24/2013, 11:18 AM

## 2013-06-25 ENCOUNTER — Inpatient Hospital Stay (HOSPITAL_COMMUNITY): Payer: Medicare Other | Admitting: Occupational Therapy

## 2013-06-25 ENCOUNTER — Inpatient Hospital Stay (HOSPITAL_COMMUNITY): Payer: Medicare Other | Admitting: *Deleted

## 2013-06-25 ENCOUNTER — Encounter (HOSPITAL_COMMUNITY): Payer: Medicare Other

## 2013-06-25 ENCOUNTER — Inpatient Hospital Stay (HOSPITAL_COMMUNITY): Payer: Medicare Other | Admitting: Speech Pathology

## 2013-06-25 DIAGNOSIS — S069X9A Unspecified intracranial injury with loss of consciousness of unspecified duration, initial encounter: Secondary | ICD-10-CM

## 2013-06-25 DIAGNOSIS — S069XAA Unspecified intracranial injury with loss of consciousness status unknown, initial encounter: Secondary | ICD-10-CM

## 2013-06-25 NOTE — Progress Notes (Addendum)
Physical Therapy Session Note  Patient Details  Name: Nathaniel Fisher MRN: [id number] Date of Birth: Nov 29, 1925  Today's Date: 06/25/2013 Time: [id number] Time Calculation (min): 54 min  Short Term Goals: Week 1:  PT Short Term Goal 1 (Week 1): Patient will perform sit<>supine with modA with HOB flat. PT Short Term Goal 2 (Week 1): Patient will perform bed<>wheelchair transfers with modA. PT Short Term Goal 3 (Week 1): Patient will perform gait training with LRAD x25' with maxA.  Skilled Therapeutic Interventions/Progress Updates:  Patient received sitting in wheelchair. Initially refusing therapy, but with encouragement is agreeable to participate. Denies pain without activity, c/o increase during session. Session focused on functional transfers, functional LE strengthening, and increasing activity tolerance. Patient performed wheelchair<>mat transfer with maxA via stand pivot secondary to significant amount of assistance needed to control lowering from stand>sit. Patient with increased difficulty lifting LEs to step/pivot during transfer. Patient performed seated LE therex: knee ext/flex, heel/toe raises, hip flex x20 each. Patient performed x5 sit<>stand transfers with R UE on therapist's shoulders. Patient refuses to continue activity. Patient exercised on NuStep Level 2 with B LE and intermittent use of R UE x5' with several prolonged rest breaks. Patient continues to require max encouragement to participate throughout session and becomes easily frustrated/irritated with therapist. Patient is self-limiting throughout session and towards end of session states "I'm done, take me back to my room." Patient left sitting in wheelchair with seatbelt donned and all needs within reach.  Therapy Documentation Precautions:  Precautions Precautions: Fall Precaution Comments: HOH, very painful LUE with limited AROM Restrictions Weight Bearing Restrictions: Yes RLE Weight Bearing: Weight bearing as  tolerated LLE Weight Bearing: Weight bearing as tolerated Pain: Pain Assessment Pain Assessment: No/denies pain Pain Score: 0-No pain Locomotion : Ambulation Ambulation/Gait Assistance: Not tested (comment)   See FIM for current functional status  Therapy/Group: Individual Therapy  Chipper HerbBridget S Isobel Eisenhuth S. Avinash Maltos, PT, DPT 06/25/2013, 9:26 AM

## 2013-06-25 NOTE — Progress Notes (Signed)
Physical Therapy Note  Patient Details  Name: Nathaniel Fisher MRN: [id number] Date of Birth: 06-17-1925 Today's Date: 06/25/2013  Patient missed 30 minutes of skilled physical therapy this PM secondary to refusal to participate. Patient received sitting in recliner, initially refusing therapy. After encouragement, patient states he would be agreeable to do therapy in his room, however, patient refuses gait, standing, LE exercises, and transfer from recliner>bed. Patient states "I can't do anything more, I'm worn out." Patient left seated in recliner with all needs within reach. Will follow up as able.  Zella RicherBridget S Madaleine Simmon S. Laquita Harlan, PT, DPT 06/25/2013, 2:46 PM
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
## 2013-06-25 NOTE — Progress Notes (Signed)
Speech Language Pathology Daily Session Note  Patient Details  Name: Nathaniel Fisher MRN: [id number] Date of Birth: 02/05/1926  Today's Date: 06/25/2013 Time: 0930-1000 Time Calculation (min): 30 min  Short Term Goals: Week 1: SLP Short Term Goal 1 (Week 1): Pt will utilize schedule to recall daily information with Mod A multimodal cueing  SLP Short Term Goal 2 (Week 1): Pt will utilize call bell to request assistance with Mod A cues SLP Short Term Goal 3 (Week 1): Pt will identify 1 physical and 1 cognitive deficit with Mod A multimodal cues.  SLP Short Term Goal 4 (Week 1): Pt will demonstrate funcitonal problem solving for basic and familair tasks with Mod A cues SLP Short Term Goal 5 (Week 1): Pt will demonstrate selective attention to a functional task for 15 minutes with Min A cues for redirection SLP Short Term Goal 6 (Week 1): Pt will maintain topic of conversation for 3 turns with Min A cues.   Skilled Therapeutic Interventions: Treatment focus on cognitive goals. SLP facilitated session by providing functional reading comprehension tasks and pt required Mod A visual and verbal cues for functional problem solving with the task. Pt also participated in written expression task and required total A for spelling of functional information. Suspect, pt's reading comprehension and written expression are at baseline level of functioning. Continue plan of care.    FIM:  Comprehension Comprehension Mode: Auditory Comprehension: 5-Understands basic 90% of the time/requires cueing < 10% of the time Expression Expression Mode: Verbal Expression: 5-Expresses basic 90% of the time/requires cueing < 10% of the time. Social Interaction Social Interaction: 4-Interacts appropriately 75 - 89% of the time - Needs redirection for appropriate language or to initiate interaction. Problem Solving Problem Solving: 2-Solves basic 25 - 49% of the time - needs direction more than half the time to initiate,  plan or complete simple activities Memory Memory: 3-Recognizes or recalls 50 - 74% of the time/requires cueing 25 - 49% of the time  Pain Pain Assessment Pain Assessment: 0-10 Pain Score: 0-No pain  Therapy/Group: Individual Therapy  Danel Studzinski 06/25/2013, 4:08 PM

## 2013-06-25 NOTE — Progress Notes (Signed)
Occupational Therapy Session Note  Patient Details  Name: Nathaniel Fisher MRN: [id number] Date of Birth: 10-Jan-1926  Today's Date: 06/25/2013 Time: 1030-1130 Time Calculation (min): 60 min  Short Term Goals: Week 1:  OT Short Term Goal 1 (Week 1): Bath: Mod Assist with LB bath OT Short Term Goal 2 (Week 1): Dressing: Mod Assist UB & LB with AE PRN OT Short Term Goal 3 (Week 1): Toilet Transfer: Mod assist OT Short Term Goal 4 (Week 1): Toileting:  Max assist OT Short Term Goal 5 (Week 1): Grooming:  2/5 tasks at sink in standing  Skilled Therapeutic Interventions/Progress Updates:    1:1 When arrived pt needed to go to the bathroom. Focused on stand pivot transfer w/c to toilet with grab bar with mod A for sit to stand and min A for stand pivot. Pt oriented x4. Pt can get easily frustrated with physical tasks due to pain and his decr speed. Pt able to perform sit to stand from toilet with mod A and total A for toileting. Min A with more than reasonable time to transfer from toilet with RW to w/c. Engaged in shaving at sink for focus on ROM in shoulders as pt could tolerate. Left in room with safety belt.   2nd session 1:1 13:00-13:55 1:1 engaged in short distance functional ambulation with RW 3 times spread out through the therapy session. Pt required more than reasonable amt of time due to pain in right UE.  Engaged in cognitive activity of modified game of call It. Pt required total A to pick up card and match it to cards on table (out of field of 4). Pt also then had difficulty with naming one item in a category. Pt demonstrated difficulty with learning task, working memory and reasoning. Pt returned to room and sat in recliner with safety belt on.   Therapy Documentation Precautions:  Precautions Precautions: Fall Precaution Comments: HOH, very painful LUE with limited AROM Restrictions Weight Bearing Restrictions: Yes RLE Weight Bearing: Weight bearing as tolerated LLE Weight  Bearing: Weight bearing as tolerated Pain: Pain Assessment Pain Assessment: 0-10 Pain Score: 0-No pain  See FIM for current functional status  Therapy/Group: Individual Therapy  [name], Llewelyn Sheaffer [facility] Hospital[facility] 06/25/2013, 4:31 PM

## 2013-06-25 NOTE — Progress Notes (Signed)
Occupational Therapy Session Note  Patient Details  Name: Wyatt HasteWillard Mcclaran MRN: [id number] Date of Birth: 04/11/1926  Today's Date: 06/25/2013 Time: 0700-0759 Time Calculation (min): 59 min  Short Term Goals: Week 1:  OT Short Term Goal 1 (Week 1): Bath: Mod Assist with LB bath OT Short Term Goal 2 (Week 1): Dressing: Mod Assist UB & LB with AE PRN OT Short Term Goal 3 (Week 1): Toilet Transfer: Mod assist OT Short Term Goal 4 (Week 1): Toileting:  Max assist OT Short Term Goal 5 (Week 1): Grooming:  2/5 tasks at sink in standing  Skilled Therapeutic Interventions/Progress Updates:    Pt resting in bed upon arrival but agreeable to bathing and dressing w/c level at sink.  Pt required max A and max verbal cues for bed mobility and stand pivot transfer.  Pt exhibited difficulty picking up feet to transfer after standing and required max A/facilitation to perform task.  Pt required max verbal cues for task initiation during bathing and dressing tasks.  Pt became frustrated during session when unable to complete tasks and would shout at therapist to assist/complete task.  Pt required extra time to complete tasks and required multiple rest breaks.  Focus on activity tolerance, safety awareness, transfers, and dynamic standing balance.   Therapy Documentation Precautions:  Precautions Precautions: Fall Precaution Comments: HOH, very painful LUE with limited AROM Restrictions Weight Bearing Restrictions: Yes RLE Weight Bearing: Weight bearing as tolerated LLE Weight Bearing: Weight bearing as tolerated Pain: Pain Assessment Pain Score: 8  Pain Type: Acute pain Pain Location: Shoulder Pain Orientation: Left Pain Descriptors / Indicators: Sore;Aching Pain Onset: With Activity Pain Intervention(s): RN made aware;Repositioned  See FIM for current functional status  Therapy/Group: Individual Therapy  Rich BraveLanier, Domenique Southers Chappell 06/25/2013, 8:02 AM

## 2013-06-25 NOTE — Progress Notes (Signed)
[facility] Individual Statement of Services  Patient Name:  Nathaniel Fisher  Date:  06/25/2013  Welcome [facility] the [facility].  Our goal is to provide you with an individualized program based on your diagnosis and situation, designed to meet your specific needs.  With this comprehensive rehabilitation program, you will be expected to participate in at least 3 hours of rehabilitation therapies Monday-Friday, with modified therapy programming on the weekends.  Your rehabilitation program will include the following services:  Physical Therapy (PT), Occupational Therapy (OT), Speech Therapy (ST), 24 hour per day rehabilitation nursing, Therapeutic Recreaction (TR), Neuropsychology, Case Management (Social Worker), Rehabilitation Medicine, Nutrition Services and Pharmacy Services  Weekly team conferences will be held on Tuesdays to discuss your progress.  Your Social Worker will talk with you frequently to get your input and to update you on team discussions.  Team conferences with you and your family in attendance may also be held.  Expected length of stay: 2-3 weeks  Overall anticipated outcome: minimum assistance  Depending on your progress and recovery, your program may change. Your Social Worker will coordinate services and will keep you informed of any changes. Your Social Worker's name and contact numbers are listed  below.  The following services may also be recommended but are not provided by the [facility]:   Driving Evaluations  Home Health Rehabiltiation Services  Outpatient Rehabilitation Services  Skilled Nursing Facility   Arrangements will be made to provide these services after discharge if needed.  Arrangements include referral to agencies that provide these services.  Your insurance has been verified to be:  Medicare and BCBS Your primary doctor is:  Dr. Marcello FennelHande  Pertinent information will be shared with your doctor  and your insurance company.  Social Worker:  KnightsenLucy Ikea Fisher, [place] [phone] or [phone]   Information discussed with and copy given to patient by: Amada JupiterHOYLE, Nathaniel Fisher, 06/25/2013, 2:30 PM

## 2013-06-26 ENCOUNTER — Inpatient Hospital Stay (HOSPITAL_COMMUNITY): Payer: Medicare Other | Admitting: Occupational Therapy

## 2013-06-26 ENCOUNTER — Inpatient Hospital Stay (HOSPITAL_COMMUNITY): Payer: Medicare Other | Admitting: *Deleted

## 2013-06-26 ENCOUNTER — Inpatient Hospital Stay (HOSPITAL_COMMUNITY): Payer: Medicare Other | Admitting: Speech Pathology

## 2013-06-26 ENCOUNTER — Encounter (HOSPITAL_COMMUNITY): Payer: Medicare Other | Admitting: Occupational Therapy

## 2013-06-26 DIAGNOSIS — S069XAA Unspecified intracranial injury with loss of consciousness status unknown, initial encounter: Secondary | ICD-10-CM

## 2013-06-26 DIAGNOSIS — S069X9A Unspecified intracranial injury with loss of consciousness of unspecified duration, initial encounter: Secondary | ICD-10-CM

## 2013-06-26 NOTE — Progress Notes (Signed)
Occupational Therapy Session Note  Patient Details  Name: Nathaniel Fisher MRN: [id number] Date of Birth: [birth year]  Today's Date: 06/26/2013 Time: [id number] Time Calculation (min): 70 min  Short Term Goals: Week 1:  OT Short Term Goal 1 (Week 1): Bath: Mod Assist with LB bath OT Short Term Goal 2 (Week 1): Dressing: Mod Assist UB & LB with AE PRN OT Short Term Goal 3 (Week 1): Toilet Transfer: Mod assist OT Short Term Goal 4 (Week 1): Toileting:  Max assist OT Short Term Goal 5 (Week 1): Grooming:  2/5 tasks at sink in standing  Skilled Therapeutic Interventions/Progress Updates:    Self care retraining at sink level (pt's choice) focus on sit to stand with min to mod  A, standing balance, activity tolerance, frustration tolerance, functional ambulation with RW with min  A with more than reasonable amt of time , toileting, functional use of bilateral UE as tolerated, etc. Pt oriented x4 using external aids, intellectual awareness with max and short term memory of earlier activity with max A.   Therapy Documentation Precautions:  Precautions Precautions: Fall Precaution Comments: HOH, very painful LUE with limited AROM Restrictions Weight Bearing Restrictions: Yes RLE Weight Bearing: Weight bearing as tolerated LLE Weight Bearing: Weight bearing as tolerated Pain: Pain Assessment Pain Assessment: 0-10 Pain Score: 2  Pain Type: Acute pain Pain Location: Shoulder Pain Orientation: Left Pain Descriptors / Indicators: Sore;Aching Pain Onset: With Activity Pain Intervention(s): Ambulation/increased activity;Repositioned Multiple Pain Sites: No  See FIM for current functional status  Therapy/Group: Individual Therapy  [name], [name] 06/26/2013, 12:07 PM

## 2013-06-26 NOTE — Progress Notes (Signed)
Subjective/Complaints: Restless night. Says there were too many noises and "lights" A 12 point review of systems has been performed and if not noted above is otherwise negative.   Objective: Vital Signs: Blood pressure 134/66, pulse 69, temperature 98.7 F (37.1 C), temperature source Oral, resp. rate 17, height 5\' 9"  (1.753 m), weight 80 kg (176 lb 5.9 oz), SpO2 97.00%. No results found. No results found for this basename: WBC, HGB, HCT, PLT,  in the last 72 hours No results found for this basename: NA, K, CL, CO, GLUCOSE, BUN, CREATININE, CALCIUM,  in the last 72 hours CBG (last 3)  No results found for this basename: GLUCAP,  in the last 72 hours  Wt Readings from Last 3 Encounters:  06/21/13 80 kg (176 lb 5.9 oz)  06/19/13 56.9 kg (125 lb 7.1 oz)    Physical Exam:  Constitutional: He is oriented to person, place, and not time. He appears well-developed.  Eyes: EOM are normal.  Neck: Normal range of motion. Neck supple. No thyromegaly present.  Cardiovascular: Normal rate and regular rhythm.  Respiratory: Effort normal and breath sounds normal. No respiratory distress.  GI: Soft. Bowel sounds are normal. He exhibits no distension.  Musculoskeletal: + drop arm left side. Left shoulder painful with PROM still--wearing sling Right hip pain with flexion, and any basic movement. Has difficulty lifting off of the bed. Left leg less tender.  Neurological: He is alert and oriented to person, place, and time. No cranial nerve deficit. Coordination normal.  Patient is hard of hearing. Follows simple commands. Reasonable recall of events surrounding accident. Impulsive, awareness impaired. LUE limited due to pain/tear. Distally it's 4/5. RUE is grossly 5/5. RLE 2- prox to 4- distally. LLE is 3 to 3+ HF to 4+/5 at ankle Skin:  Multiple facial and upper extremity abrasions and bruising to the forehead  Psychiatric:  Tangential, a little irritable      Assessment/Plan: 1. Functional  deficits secondary to TBI, pelvic fx's, left rotator cuff tear which require 3+ hours per day of interdisciplinary therapy in a comprehensive inpatient rehab setting. Physiatrist is providing close team supervision and 24 hour management of active medical problems listed below. Physiatrist and rehab team continue to assess barriers to discharge/monitor patient progress toward functional and medical goals. FIM: FIM - Bathing Bathing Steps Patient Completed: Chest;Left Arm;Abdomen;Front perineal area;Right upper leg;Left upper leg Bathing: 3: Mod-Patient completes 5-7 94f 10 parts or 50-74%  FIM - Upper Body Dressing/Undressing Upper body dressing/undressing steps patient completed: Pull shirt over trunk Upper body dressing/undressing: 2: Max-Patient completed 25-49% of tasks FIM - Lower Body Dressing/Undressing Lower body dressing/undressing steps patient completed: Thread/unthread right pants leg Lower body dressing/undressing: 1: Total-Patient completed less than 25% of tasks  FIM - Toileting Toileting: 1: Two helpers  FIM - Diplomatic Services operational officer Devices: Grab bars;Walker Toilet Transfers: 3-From toilet/BSC: Mod A (lift or lower assist);3-To toilet/BSC: Mod A (lift or lower assist)  FIM - Press photographer Assistive Devices: Arm rests Bed/Chair Transfer: 2: Bed > Chair or W/C: Max A (lift and lower assist);2: Chair or W/C > Bed: Max A (lift and lower assist)  FIM - Locomotion: Wheelchair Distance: 15 Locomotion: Wheelchair: 1: Total Assistance/staff pushes wheelchair (Pt<25%) FIM - Locomotion: Ambulation Ambulation/Gait Assistance: Not tested (comment) Locomotion: Ambulation: 0: Activity did not occur  Comprehension Comprehension Mode: Auditory Comprehension: 5-Understands basic 90% of the time/requires cueing < 10% of the time  Expression Expression Mode: Verbal Expression: 5-Expresses basic 90% of the  time/requires cueing < 10% of the  time.  Social Interaction Social Interaction: 4-Interacts appropriately 75 - 89% of the time - Needs redirection for appropriate language or to initiate interaction.  Problem Solving Problem Solving: 2-Solves basic 25 - 49% of the time - needs direction more than half the time to initiate, plan or complete simple activities  Memory Memory: 2-Recognizes or recalls 25 - 49% of the time/requires cueing 51 - 75% of the time  Medical Problem List and Plan:  1. TBI/left intraventricular hemorrhage/ right pubic ramus and Ischial fracture--WBAT. Likely left rotator cuff tear.    2.. DVT Prophylaxis/Anticoagulation: SCD's, mobility  3. Pain Management: Oxycodone as needed. Monitor with increased mobility   -added voltaren gel for left shoulder--   -sling   for pain control   -injection? 4. Mood/depression.Zoloft 25mg  daily Provide emotional support.  5. Neuropsych: This patient is not capable of making decisions on His own behalf.  6.Left maxillary sinus fracture. Conservative care  7.Hypertension.Lisinopril 20 mg every a.m. and 10 mg each bedtime. With control.  8.BPH.Flomax 0.4mg  QHS.      LOS (Days) 5 A FACE TO FACE EVALUATION WAS PERFORMED  Nathaniel Fisher 06/26/2013 8:07 AM

## 2013-06-26 NOTE — Progress Notes (Signed)
Speech Language Pathology Daily Session Note  Patient Details  Name: Nathaniel Fisher Prows MRN: [id number] Date of Birth: 1926-05-16  Today's Date: 06/26/2013 Time: [id number] Time Calculation (min): 45 min  Short Term Goals: Week 1: SLP Short Term Goal 1 (Week 1): Pt will utilize schedule to recall daily information with Mod A multimodal cueing  SLP Short Term Goal 2 (Week 1): Pt will utilize call bell to request assistance with Mod A cues SLP Short Term Goal 3 (Week 1): Pt will identify 1 physical and 1 cognitive deficit with Mod A multimodal cues.  SLP Short Term Goal 4 (Week 1): Pt will demonstrate funcitonal problem solving for basic and familair tasks with Mod A cues SLP Short Term Goal 5 (Week 1): Pt will demonstrate selective attention to a functional task for 15 minutes with Min A cues for redirection SLP Short Term Goal 6 (Week 1): Pt will maintain topic of conversation for 3 turns with Min A cues.   Skilled Therapeutic Interventions: Skilled treatment session focus on cognitive goals. SLP facilitated session by providing Max multimodal cueing for recall of events from previous therapy sessions. Pt also required Mod-Max verbal cues for encouragement to participate in cognitive tasks such as basic money management and basic medication management tasks. Pt unable to recall medications by name or function but reports he recalls medications based on how they look, therefore, unable to facilitate medication management at this time. Continue plan of care.    FIM:  Comprehension Comprehension Mode: Auditory Comprehension: 4-Understands basic 75 - 89% of the time/requires cueing 10 - 24% of the time Expression Expression: 4-Expresses basic 75 - 89% of the time/requires cueing 10 - 24% of the time. Needs helper to occlude trach/needs to repeat words. Social Interaction Social Interaction: 3-Interacts appropriately 50 - 74% of the time - May be physically or verbally inappropriate. Problem  Solving Problem Solving: 2-Solves basic 25 - 49% of the time - needs direction more than half the time to initiate, plan or complete simple activities Memory Memory: 2-Recognizes or recalls 25 - 49% of the time/requires cueing 51 - 75% of the time  Pain Pain Assessment Pain Assessment: No/denies pain   Therapy/Group: Individual Therapy  Kameria Canizares 06/26/2013, 12:58 PM

## 2013-06-26 NOTE — Progress Notes (Signed)
Physical Therapy Session Note  Patient Details  Name: Nathaniel Fisher MRN: [id number] Date of Birth: [birth year]  Today's Date: 06/26/2013 Time: [id number] and [id number] Time Calculation (min): 60 min and 45 min  Short Term Goals: Week 1:  PT Short Term Goal 1 (Week 1): Patient will perform sit<>supine with modA with HOB flat. PT Short Term Goal 2 (Week 1): Patient will perform bed<>wheelchair transfers with modA. PT Short Term Goal 3 (Week 1): Patient will perform gait training with LRAD x25' with maxA.  Skilled Therapeutic Interventions/Progress Updates:    AM Session: Patient received supine in bed with HOB elevated. Patient requires max encouragement to get out of bed. Patient maxA supine>sit with HOB elevated and use of bedrails, modA stand pivot transfer bed>wheelchair with use of RW. Gait training 16' x1 and 2638' x1 with RW and minA. Patient requires verbal cues to maintain BOS within RW, however, patient not receptive to cues for gait or safety, becoming increasingly irritated/frustrated with therapist and yelling. Patient performed horseshoe toss x5 with L UE on RW for support and minA. Patient requires several prolonged rest breaks throughout session and argumentative with participation throughout. Patient left sitting in wheelchair with all needs within reach.  PM Session: Patient received sitting in recliner. Co-treat with Rec Therapy focusing on increasing activity tolerance through static standing to engage in activity in which patient lists crops and which season they are planted. Patient stood x approx 8' with minA to engage in activity. One instance of poor frustration tolerance in which patient became irritated, yelling at therapist that he could not write. Patient returned to room and left seated in recliner with seatbelt donned and all needs within reach.  Therapy Documentation Precautions:  Precautions Precautions: Fall Precaution Comments: HOH, very painful LUE with limited  AROM Restrictions Weight Bearing Restrictions: Yes RLE Weight Bearing: Weight bearing as tolerated LLE Weight Bearing: Weight bearing as tolerated Pain: Pain Assessment Pain Assessment: 0-10 Pain Score: 2  Pain Type: Acute pain Pain Location: Shoulder Pain Orientation: Left Pain Descriptors / Indicators: Sore;Aching Pain Onset: With Activity Pain Intervention(s): Ambulation/increased activity;Repositioned Multiple Pain Sites: No Locomotion : Ambulation Ambulation/Gait Assistance: 4: Min assist   See FIM for current functional status  Therapy/Group: Individual Therapy and Co-Treatment in PM with Rec Therapy  Chipper HerbBridget S Kenn Rekowski S. Lisa-Marie Rueger, PT, DPT 06/26/2013, 10:49 AM

## 2013-06-26 NOTE — Progress Notes (Signed)
Occupational Therapy Session Note  Patient Details  Name: Nathaniel Fisher MRN: [id number] Date of Birth: 04-Feb-1926  Today's Date: 06/26/2013 Time: [id number] Time Calculation (min): 45 min  Short Term Goals: Week 1:  OT Short Term Goal 1 (Week 1): Bath: Mod Assist with LB bath OT Short Term Goal 2 (Week 1): Dressing: Mod Assist UB & LB with AE PRN OT Short Term Goal 3 (Week 1): Toilet Transfer: Mod assist OT Short Term Goal 4 (Week 1): Toileting:  Max assist OT Short Term Goal 5 (Week 1): Grooming:  2/5 tasks at sink in standing  Skilled Therapeutic Interventions/Progress Updates:    Pt sitting in recliner with gait belt on arrival. Pt engaged in cognitive and sequencing task. Pt provided a picture to help patient build structure from piping. Pt unable to complete pipe building task with extra pieces provided. Pt needed v/c and visual cues mod (A). Pt needed extended time and redirection to attend to picture as reference. Pt educated on using a reacher to doff sock on Rt LE mod (A). Pt demonstrates ability to cross bil LE this session. Pt able to don LT LE sock and needed (A) with Rt LE socks. Pt demonstrates sustain attention to task throughout session.  Therapy Documentation Precautions:  Precautions Precautions: Fall Precaution Comments: HOH, very painful LUE with limited AROM Restrictions Weight Bearing Restrictions: Yes RLE Weight Bearing: Weight bearing as tolerated LLE Weight Bearing: Weight bearing as tolerated Pain: No pain reported    See FIM for current functional status  Therapy/Group: Individual Therapy  [name], [facility] 06/26/2013, 3:54 PM

## 2013-06-27 ENCOUNTER — Inpatient Hospital Stay (HOSPITAL_COMMUNITY): Payer: Medicare Other | Admitting: *Deleted

## 2013-06-27 ENCOUNTER — Encounter (HOSPITAL_COMMUNITY): Payer: Medicare Other

## 2013-06-27 ENCOUNTER — Inpatient Hospital Stay (HOSPITAL_COMMUNITY): Payer: Medicare Other

## 2013-06-27 ENCOUNTER — Inpatient Hospital Stay (HOSPITAL_COMMUNITY): Payer: Medicare Other | Admitting: Speech Pathology

## 2013-06-27 ENCOUNTER — Inpatient Hospital Stay (HOSPITAL_COMMUNITY): Payer: Medicare Other | Admitting: Occupational Therapy

## 2013-06-27 DIAGNOSIS — S069XAA Unspecified intracranial injury with loss of consciousness status unknown, initial encounter: Secondary | ICD-10-CM

## 2013-06-27 DIAGNOSIS — S069X9A Unspecified intracranial injury with loss of consciousness of unspecified duration, initial encounter: Secondary | ICD-10-CM

## 2013-06-27 NOTE — Progress Notes (Signed)
Occupational Therapy Session Note  Patient Details  Name: Nathaniel Fisher MRN: [id number] Date of Birth: 13-Jul-1925  Today's Date: 06/27/2013 Time: [id number] Time Calculation (min): 45 min  Short Term Goals: Week 1:  OT Short Term Goal 1 (Week 1): Bath: Mod Assist with LB bath OT Short Term Goal 2 (Week 1): Dressing: Mod Assist UB & LB with AE PRN OT Short Term Goal 3 (Week 1): Toilet Transfer: Mod assist OT Short Term Goal 4 (Week 1): Toileting:  Max assist OT Short Term Goal 5 (Week 1): Grooming:  2/5 tasks at sink in standing  Skilled Therapeutic Interventions/Progress Updates:    1:1 Focus on toileting in standing while using urinal to void. Pt required a person to steady him for him to use the urinal. Pt required mod A for clothing management after voiding trial, including fasteners. Pt ambulated with steady A to sink to wash hands with setup for supplies. Pt then was able to demonstrate functional ambulation from room RN station without requiring a rest break. Pt with improved pace and ability to pick up feet instead of shuffling. Participated in sequencing picture card task (3 cards) with extra time without cuing. Pt then able to ambulate from dayroom back to room with A for pathway finding.   Therapy Documentation Precautions:  Precautions Precautions: Fall Precaution Comments: HOH, very painful LUE with limited AROM Restrictions Weight Bearing Restrictions: Yes RLE Weight Bearing: Weight bearing as tolerated LLE Weight Bearing: Weight bearing as tolerated Pain: Pain Assessment Pain Assessment: No/denies pain   See FIM for current functional status  Therapy/Group: Individual Therapy  [name], [name] 06/27/2013, 1:59 PM

## 2013-06-27 NOTE — Progress Notes (Signed)
Speech Language Pathology Daily Session Note  Patient Details  Name: Nathaniel Fisher MRN: [id number] Date of Birth: February 01, 1926  Today's Date: 06/27/2013 Time: [id number] Time Calculation (min): 25 min  Short Term Goals: Week 1: SLP Short Term Goal 1 (Week 1): Pt will utilize schedule to recall daily information with Mod A multimodal cueing  SLP Short Term Goal 2 (Week 1): Pt will utilize call bell to request assistance with Mod A cues SLP Short Term Goal 3 (Week 1): Pt will identify 1 physical and 1 cognitive deficit with Mod A multimodal cues.  SLP Short Term Goal 4 (Week 1): Pt will demonstrate funcitonal problem solving for basic and familair tasks with Mod A cues SLP Short Term Goal 5 (Week 1): Pt will demonstrate selective attention to a functional task for 15 minutes with Min A cues for redirection SLP Short Term Goal 6 (Week 1): Pt will maintain topic of conversation for 3 turns with Min A cues.   Skilled Therapeutic Interventions: Skilled treatment session focused on cognitive goals. SLP facilitated session by providing Mod A question cues for intellectual awareness into physical deficits and for recall of previous therapy sessions. Pt also required Min question cues for functional problem solving in regards to appropriate meal options to increase overall PO intake and efficient mastication. Continue plan of care.    FIM:  Comprehension Comprehension Mode: Auditory Comprehension: 4-Understands basic 75 - 89% of the time/requires cueing 10 - 24% of the time Expression Expression Mode: Verbal Expression: 4-Expresses basic 75 - 89% of the time/requires cueing 10 - 24% of the time. Needs helper to occlude trach/needs to repeat words. Social Interaction Social Interaction: 3-Interacts appropriately 50 - 74% of the time - May be physically or verbally inappropriate. Problem Solving Problem Solving: 2-Solves basic 25 - 49% of the time - needs direction more than half the time to  initiate, plan or complete simple activities Memory Memory: 2-Recognizes or recalls 25 - 49% of the time/requires cueing 51 - 75% of the time  Pain Pain Assessment Pain Assessment: No/denies pain Pain Score: 0-No pain  Therapy/Group: Individual Therapy  Irlene Crudup 06/27/2013, 3:35 PM

## 2013-06-27 NOTE — Progress Notes (Signed)
Physical Therapy Session Note  Patient Details  Name: Nathaniel Fisher MRN: [id number] Date of Birth: [birth year]  Today's Date: 06/27/2013 Time: 1100-1155 and [id number] Time Calculation (min): 55 min and 12 min  Short Term Goals: Week 1:  PT Short Term Goal 1 (Week 1): Patient will perform sit<>supine with modA with HOB flat. PT Short Term Goal 2 (Week 1): Patient will perform bed<>wheelchair transfers with modA. PT Short Term Goal 3 (Week 1): Patient will perform gait training with LRAD x25' with maxA.  Skilled Therapeutic Interventions/Progress Updates:    AM Session: Patient received sitting in wheelchair. Co-treatment with Rec Therapy during first half of session. Session focused on increasing activity tolerance with functional mobility and balance training and cognitive remediation. Patient performed dynamic standing balance activity: tossing/catching ball while naming "things you can find on a farm" to facilitate cognitive challenge during dual tasking; hitting ball with bat. Patient performed gait training 3822' x2 with RW and minA, shuffling gait pattern, trunk flexed. Wheelchair mobility 100' with minA using B LEs, cushion on wheelchair removed. Patient requires 4 rest breaks and demonstrates poor frustration tolerance with activity. Patient left sitting in wheelchair with seatbelt donned and all needs within reach.  PM Session: Patient received sitting in wheelchair. Patient refusing therapy and stating "I can only do so much and this is too much." Patient encouraged in various ways to participate in therapy (go to the bathroom, get in bed, etc.) and patient eventually agreeable to transfer wheelchair>recliner. Patient performs sit>stand with modA using RW and then ambulates 5' to recliner to sit. Patient left sitting in recliner with all needs within reach and seatbelt donned.  Therapy Documentation Precautions:  Precautions Precautions: Fall Precaution Comments: HOH, very painful LUE  with limited AROM Restrictions Weight Bearing Restrictions: Yes RLE Weight Bearing: Weight bearing as tolerated LLE Weight Bearing: Weight bearing as tolerated Pain: Pain Assessment Pain Assessment: No/denies pain Pain Score: 0-No pain Pain Type: Acute pain Pain Location: Shoulder Pain Orientation: Left Pain Descriptors / Indicators: Aching;Sore Pain Onset: With Activity Pain Intervention(s): RN made aware;Repositioned Locomotion : Ambulation Ambulation/Gait Assistance: 4: Min assist Wheelchair Mobility Distance: 100   See FIM for current functional status  Therapy/Group: Individual Therapy in PM and Co-Treatment with Rec Therapy in the AM  SunocoBridget S Estel Scholze S. Ferrel [place], PT, DPT 06/27/2013, 1:28 PM
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
## 2013-06-27 NOTE — Progress Notes (Signed)
Occupational Therapy Session Note  Patient Details  Name: Nathaniel Fisher MRN: [id number] Date of Birth: Aug 20, 1925  Today's Date: 06/27/2013  Session 1 Time: 0900-1000 Time Calculation (min): 60 min  Short Term Goals: Week 1:  OT Short Term Goal 1 (Week 1): Bath: Mod Assist with LB bath OT Short Term Goal 2 (Week 1): Dressing: Mod Assist UB & LB with AE PRN OT Short Term Goal 3 (Week 1): Toilet Transfer: Mod assist OT Short Term Goal 4 (Week 1): Toileting:  Max assist OT Short Term Goal 5 (Week 1): Grooming:  2/5 tasks at sink in standing  Skilled Therapeutic Interventions/Progress Updates:    Pt resting in bed upon arrival and agreeable to bathing and dressing with sit<>stand from w/c at sink.  Pt required max A for stand pivot transfer.  Pt states he is fearful when standing up and was constantly reaching for the chair.  After completing bathing tasks patient requested to use toilet and performed toilet transfers using grab bars requiring min A.  Pt encouraged to perform toilet hygiene but was resistant and became argumentative while stating that he couldn't do it himself. Pt completed dressing tasks at sink requiring assistance to threading pants. Pt exhibited increased ability to perform sit->stands at sink and required only steady A at end of session.  Pt required multiple rest breaks throughout session and extra time to complete tasks.  Therapy Documentation Precautions:  Precautions Precautions: Fall Precaution Comments: HOH, very painful LUE with limited AROM Restrictions Weight Bearing Restrictions: Yes RLE Weight Bearing: Weight bearing as tolerated LLE Weight Bearing: Weight bearing as tolerated Pain: Pain Assessment Pain Assessment: 0-10 Pain Score: 4  Pain Type: Acute pain Pain Location: Shoulder Pain Orientation: Left Pain Descriptors / Indicators: Aching;Sore Pain Onset: With Activity Pain Intervention(s): RN made aware;Repositioned  See FIM for current  functional status  Therapy/Group: Individual Therapy  Session 2 Time: 1345-1430 Pt denies pain Individual Therapy  Pt engaged in scanning task seated in w/c.  Pt required to scan board with playing cards for specific cards.  Pt required extra time to complete tasks and frequently was unable to locate card on board.  Pt frequently referred back to card in hand to remember what card he was looking for.  Pt initially was resistant to activity until it was explained that the activity was for "exercising his brain."  Pt required one rest break during session.  Pt exhibited frequent frustration during activity when unable to locate card.  Lavone NeriLanier, Jess Toney [facility] Hospital[facility]ell 06/27/2013, 10:05 AM

## 2013-06-27 NOTE — Progress Notes (Signed)
Recreational Therapy Session Note  Patient Details  Name: Nathaniel Fisher MRN: [id number] Date of Birth: [birth year] Today's Date: 06/27/2013  Pain: no c/o  Skilled Therapeutic Interventions/Progress Updates: Session focused on activity tolerance, standing tolerance, dynamic standing balance, selective & alternating attention.  Pt stood with min assist without UE support for ball toss activity naming items within a category with min cues.  Pt stood for baseball activity hitting a beach ball with plastic bat with min assist.  Therapy/Group: Co-Treatment Nathaniel Fisher 06/27/2013, 12:21 PM
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
## 2013-06-27 NOTE — Progress Notes (Signed)
Social Work Patient ID: Nathaniel Fisher, male   DOB: October 04, 1925, 80 y.o.   MRN: [id number]  Met with pt this afternoon to follow up on Dr. Naaman Plummer' discussion from the morning.  Stressed to pt as clearly as I could that he needs 24/7 care and best plan for him now is to continue rehab at [facility].  Explained that I have spoken with family and confirmed that they can neither provide this care themselves nor have the money to hire this care for him.  Attempted to explain that I had been told by family that he "had a bad experience" at the SNF he was at in 2006.  He quickly contradicts that and claims the facility was very nice.  I went with this comment and stressed that I will be working to get him into a facility like that one and he nods "yes".  Spoke with both daughter and son to review team conference info and let them know that the MD and I have spoken with pt about SNF and he has not refused.  Plan to follow up again with pt tomorrow and will proceed with SNF bed search.  Leopold Smyers, LCSW

## 2013-06-28 ENCOUNTER — Inpatient Hospital Stay (HOSPITAL_COMMUNITY): Payer: Medicare Other | Admitting: Speech Pathology

## 2013-06-28 ENCOUNTER — Encounter (HOSPITAL_COMMUNITY): Payer: Medicare Other

## 2013-06-28 ENCOUNTER — Inpatient Hospital Stay (HOSPITAL_COMMUNITY): Payer: Medicare Other | Admitting: *Deleted

## 2013-06-28 ENCOUNTER — Inpatient Hospital Stay (HOSPITAL_COMMUNITY): Payer: Medicare Other | Admitting: Occupational Therapy

## 2013-06-28 NOTE — Progress Notes (Signed)
Speech Language Pathology Daily Session Notes  Patient Details  Name: Nathaniel Fisher MRN: [id number] Date of Birth: [birth year]  Today's Date: 06/28/2013  Session 1 Time: 1300-1350 Time Calculation (min): 50 min  Session 2 Time: 1500-1510 Time Calculation: 10 min  Short Term Goals: Week 1: SLP Short Term Goal 1 (Week 1): Pt will utilize schedule to recall daily information with Mod A multimodal cueing  SLP Short Term Goal 2 (Week 1): Pt will utilize call bell to request assistance with Mod A cues SLP Short Term Goal 3 (Week 1): Pt will identify 1 physical and 1 cognitive deficit with Mod A multimodal cues.  SLP Short Term Goal 4 (Week 1): Pt will demonstrate funcitonal problem solving for basic and familair tasks with Mod A cues SLP Short Term Goal 5 (Week 1): Pt will demonstrate selective attention to a functional task for 15 minutes with Min A cues for redirection SLP Short Term Goal 6 (Week 1): Pt will maintain topic of conversation for 3 turns with Min A cues.   Skilled Therapeutic Interventions:  Session 1: Skilled treatment session focused on cognitive goals. SLP facilitated session by providing Max A multimodal cueing for intellectual awareness into cognitive deficits and overall safety awareness in regards to tasks he could do without assistance. Pt recalled minimal events from previous therapy sessions and required Max cueing for recall of events and utilization of external aids. Pt requires encouragement for participation throughout the session due to decreased insight/awareness into deficits. Continue plan of care.    Session 2: Treatment focus on cognitive goals. Upon arrival, pt sitting in recliner watching a blank screen on the television and required Mod question cues for functional problem solving. Pt declined any further participation in session due to fatigue. Continue plan of care.   FIM:  Comprehension Comprehension Mode: Auditory Comprehension: 4-Understands basic  75 - 89% of the time/requires cueing 10 - 24% of the time Expression Expression Mode: Verbal Expression: 4-Expresses basic 75 - 89% of the time/requires cueing 10 - 24% of the time. Needs helper to occlude trach/needs to repeat words. Social Interaction Social Interaction: 3-Interacts appropriately 50 - 74% of the time - May be physically or verbally inappropriate. Problem Solving Problem Solving: 2-Solves basic 25 - 49% of the time - needs direction more than half the time to initiate, plan or complete simple activities Memory Memory: 2-Recognizes or recalls 25 - 49% of the time/requires cueing 51 - 75% of the time  Pain Pain Assessment Pain Assessment: No/denies pain Pain Score: 0-No pain  Therapy/Group: Individual Therapy  Toniqua Melamed 06/28/2013, 3:43 PM

## 2013-06-28 NOTE — Progress Notes (Signed)
Recreational Therapy Session Note  Patient Details  Name: Nathaniel Fisher MRN: [id number] Date of Birth: 01/05/1926 Today's Date: 06/28/2013  Pain: c/o intermittent pain, premedicated, relieved with rest Skilled Therapeutic Interventions/Progress Updates: Pt continues to become easily frustrated throughout session requiring verbal cues for de-escalation and encouragement.  Attempted to give pt choices for therapeutic interventions, but pt states "he'll do whatever y'all want me to do."  Session focused on ambulation with RW, & furniture transfers.  Pt requires min assist for ambulation on tile & carpeted surfaces as well as for transfers.  Portion of session occurred in [employer] to created more of a home like setting to assist with anxiety/frustration tolerance.  Therapy/Group: Co-Treatment  Anginette Espejo 06/28/2013, 4:00 PM

## 2013-06-28 NOTE — Progress Notes (Signed)
Subjective/Complaints: Slept fairly well. Denies new problems. .  A 12 point review of systems has been performed and if not noted above is otherwise negative.   Objective: Vital Signs: Blood pressure 112/62, pulse 59, temperature 98.7 F (37.1 C), temperature source Oral, resp. rate 18, height 5\' 9"  (1.753 m), weight 80 kg (176 lb 5.9 oz), SpO2 99.00%. No results found. No results found for this basename: WBC, HGB, HCT, PLT,  in the last 72 hours No results found for this basename: NA, K, CL, CO, GLUCOSE, BUN, CREATININE, CALCIUM,  in the last 72 hours CBG (last 3)  No results found for this basename: GLUCAP,  in the last 72 hours  Wt Readings from Last 3 Encounters:  06/21/13 80 kg (176 lb 5.9 oz)  06/19/13 56.9 kg (125 lb 7.1 oz)    Physical Exam:  Constitutional: He is oriented to person, place, and not time. He appears well-developed.  Eyes: EOM are normal.  Neck: Normal range of motion. Neck supple. No thyromegaly present.  Cardiovascular: Normal rate and regular rhythm.  Respiratory: Effort normal and breath sounds normal. No respiratory distress.  GI: Soft. Bowel sounds are normal. He exhibits no distension.  Musculoskeletal: + drop arm left side. Left shoulder painful with PROM still--wearing sling Right hip pain with  ROM.   Left leg less tender.  Neurological: He is alert and oriented to person, place, and time. No cranial nerve deficit. Coordination normal.  Patient is hard of hearing. Follows simple commands. Reasonable recall of events surrounding accident.   awareness impaired. LUE limited due to pain/tear. Distally it's 4/5. RUE is grossly 5/5. RLE 2- prox to 4- distally. LLE is 3 to 3+ HF to 4+/5 at ankle Skin:  Multiple facial and upper extremity abrasions and bruising to the forehead which is slowly resolving Psychiatric:  Tangential still. Generally pleasant and cooperative      Assessment/Plan: 1. Functional deficits secondary to TBI, pelvic fx's, left  rotator cuff tear which require 3+ hours per day of interdisciplinary therapy in a comprehensive inpatient rehab setting. Physiatrist is providing close team supervision and 24 hour management of active medical problems listed below. Physiatrist and rehab team continue to assess barriers to discharge/monitor patient progress toward functional and medical goals.  SW working on SNF placement for patient who agrees with plan   FIM: FIM - Bathing Bathing Steps Patient Completed: Chest;Left Arm;Abdomen;Front perineal area;Right upper leg;Left upper leg Bathing: 3: Mod-Patient completes 5-7 72f 10 parts or 50-74%  FIM - Upper Body Dressing/Undressing Upper body dressing/undressing steps patient completed: Thread/unthread right sleeve of pullover shirt/dresss;Thread/unthread left sleeve of pullover shirt/dress;Pull shirt over trunk Upper body dressing/undressing: 4: Min-Patient completed 75 plus % of tasks FIM - Lower Body Dressing/Undressing Lower body dressing/undressing steps patient completed: Pull pants up/down;Fasten/unfasten pants;Thread/unthread left pants leg Lower body dressing/undressing: 2: Max-Patient completed 25-49% of tasks  FIM - Toileting Toileting: 1: Total-Patient completed zero steps, helper did all 3  FIM - Diplomatic Services operational officer Devices: Grab bars;Elevated toilet seat Toilet Transfers: 4-To toilet/BSC: Min A (steadying Pt. > 75%);4-From toilet/BSC: Min A (steadying Pt. > 75%)  FIM - Bed/Chair Transfer Bed/Chair Transfer Assistive Devices: Arm rests;Walker Bed/Chair Transfer: 3: Sit > Supine: Mod A (lifting assist/Pt. 50-74%/lift 2 legs);3: Bed > Chair or W/C: Mod A (lift or lower assist)  FIM - Locomotion: Wheelchair Distance: 100 Locomotion: Wheelchair: 2: Travels 50 - 149 ft with minimal assistance (Pt.>75%) FIM - Locomotion: Ambulation Locomotion: Ambulation Assistive Devices: Designer, industrial/product  Ambulation/Gait Assistance: 4: Min  assist Locomotion: Ambulation: 1: Travels less than 50 ft with minimal assistance (Pt.>75%)  Comprehension Comprehension Mode: Auditory Comprehension: 4-Understands basic 75 - 89% of the time/requires cueing 10 - 24% of the time  Expression Expression Mode: Verbal Expression: 4-Expresses basic 75 - 89% of the time/requires cueing 10 - 24% of the time. Needs helper to occlude trach/needs to repeat words.  Social Interaction Social Interaction: 3-Interacts appropriately 50 - 74% of the time - May be physically or verbally inappropriate.  Problem Solving Problem Solving: 2-Solves basic 25 - 49% of the time - needs direction more than half the time to initiate, plan or complete simple activities  Memory Memory: 2-Recognizes or recalls 25 - 49% of the time/requires cueing 51 - 75% of the time  Medical Problem List and Plan:  1. TBI/left intraventricular hemorrhage/ right pubic ramus and Ischial fracture--WBAT. Likely left rotator cuff tear.    2.. DVT Prophylaxis/Anticoagulation: SCD's, mobility  3. Pain Management: Oxycodone as needed. Monitor with increased mobility   -added voltaren gel for left shoulder--   -sling   for pain control   -still can consider injection 4. Mood/depression.Zoloft 25mg  daily Provide emotional support.  5. Neuropsych: This patient is not capable of making decisions on His own behalf.  6.Left maxillary sinus fracture. Conservative care  7.Hypertension.Lisinopril 20 mg every a.m. and 10 mg each bedtime. With control.  8.BPH.Flomax 0.4mg  QHS.      LOS (Days) 7 A FACE TO FACE EVALUATION WAS PERFORMED  SWARTZ,ZACHARY T 06/28/2013 9:14 AM

## 2013-06-28 NOTE — Progress Notes (Signed)
Occupational Therapy Session Note  Patient Details  Name: Nathaniel Fisher MRN: [id number] Date of Birth: 01-Feb-1926  Today's Date: 06/28/2013 Time: 1000-1030 Time Calculation (min): 30 min  Short Term Goals: Week 1:  OT Short Term Goal 1 (Week 1): Bath: Mod Assist with LB bath OT Short Term Goal 2 (Week 1): Dressing: Mod Assist UB & LB with AE PRN OT Short Term Goal 3 (Week 1): Toilet Transfer: Mod assist OT Short Term Goal 4 (Week 1): Toileting:  Max assist OT Short Term Goal 5 (Week 1): Grooming:  2/5 tasks at sink in standing  Skilled Therapeutic Interventions/Progress Updates:  Patient resting in w/c upon arrival with QRB on.  Engaged in standing at sink for 2 grooming tasks and ambulation out into the hall then back.   Patient able to stand at sink with supervision to complete 2 grooming tasks before insisting to sit back down.  Patient required encouragement redirection to participate secondary he believes he should not be doing "all of this activity, standing and walking" until his bones heal. Reminded patient that the doctor would not have allowed fro him to come to rehab if he did not think the patient could do the work.  Patient gets upset and frustrated when encouraged to participate in therapy then later apologizes for his behavior.  Patient resting in w/c with QRB in place.   Therapy Documentation Precautions:  Precautions Precautions: Fall Precaution Comments: HOH, very painful LUE with limited AROM Restrictions Weight Bearing Restrictions: Yes RLE Weight Bearing: Weight bearing as tolerated LLE Weight Bearing: Weight bearing as tolerated Pain: Denies pain at rest, during standing and ambulating he reports 5/10 pain in groin area.  Rest and repostioned  Therapy/Group: Individual Therapy  Briseidy Spark 06/28/2013, 12:25 PM

## 2013-06-28 NOTE — Progress Notes (Signed)
Occupational Therapy Session Note  Patient Details  Name: Nathaniel Fisher MRN: [id number] Date of Birth: [birth year]  Today's Date: 06/28/2013  Time: 0900-0956 Time Calculation (min): 56 min  Short Term Goals: Week 1:  OT Short Term Goal 1 (Week 1): Bath: Mod Assist with LB bath OT Short Term Goal 2 (Week 1): Dressing: Mod Assist UB & LB with AE PRN OT Short Term Goal 3 (Week 1): Toilet Transfer: Mod assist OT Short Term Goal 4 (Week 1): Toileting:  Max assist OT Short Term Goal 5 (Week 1): Grooming:  2/5 tasks at sink in standing  Skilled Therapeutic Interventions/Progress Updates:    Pt resting in bed upon arrival and initially declined bathing at shower level.  Pt agreed after encouragement and amb with RW to bathroom for shower in walk-in shower.  Pt required completed bathing tasks with sit<>stand from tub bench using grab bars.  Pt completed dressing tasks with sit<>stand from EOB.  Pt required mod verbal cues for safety awareness with ambulation and with transfers.  Pt required multiple rest breaks throughout session and extra time to complete tasks.  Pt exhibits low frustration tolerance and requires min verbal cues to relax when his behaviors escalate secondary to frustration with inability to complete tasks or when he becomes tired.  Focus on activity tolerance, safety awareness, functional amb with RW, dynamic standing balance, and transfers.  Therapy Documentation Precautions:  Precautions Precautions: Fall Precaution Comments: HOH, very painful LUE with limited AROM Restrictions Weight Bearing Restrictions: Yes RLE Weight Bearing: Weight bearing as tolerated LLE Weight Bearing: Weight bearing as tolerated   Pain: Pain Assessment Pain Assessment: No/denies pain  See FIM for current functional status  Therapy/Group: Individual Therapy  Nathaniel Fisher, Nathaniel Fisher 06/28/2013, 9:59 AM

## 2013-06-28 NOTE — Progress Notes (Signed)
Physical Therapy Session Note  Patient Details  Name: Nathaniel Fisher MRN: [id number] Date of Birth: [birth year]  Today's Date: 06/28/2013 Time: 1100-1200 and 1350-1407 Time Calculation (min): 60 min and 17 min  Short Term Goals: Week 1:  PT Short Term Goal 1 (Week 1): Patient will perform sit<>supine with modA with HOB flat. PT Short Term Goal 2 (Week 1): Patient will perform bed<>wheelchair transfers with modA. PT Short Term Goal 3 (Week 1): Patient will perform gait training with LRAD x25' with maxA.  Skilled Therapeutic Interventions/Progress Updates:    AM Session: Patient received sitting in wheelchair. Session focused on functional transfers, gait training, and stair negotiation. Patient negotiated 3 stairs with B handrails and minA, step to pattern. Patient performed gait training 4535' x2 with RW and minA in solarium, negotiating tile and carpeted surfaces. Furniture transfers and initiated relaxation strategies to decrease anxiety in home-like setting in [employer]. Gait training in hospital hallway 143' x1 with RW and minA. Patient continues to become irritated/frustrated throughout sessions and requires continued cues to participate. Patient returned to room and left seated in wheelchair with seatbelt donned and all needs within reach.  PM Session: Patient received sitting in wheelchair. Patient refusing therapy and stating "I've already done enough today." Patient encouraged in various ways to participate in therapy (go to the bathroom, get in bed, LE exercises, etc.) and patient eventually agreeable to transfer wheelchair>recliner. Patient performs sit>stand with minA using RW and then ambulates 8' to recliner to sit. Patient left sitting in recliner with all needs within reach and seatbelt donned.  Therapy Documentation Precautions:  Precautions Precautions: Fall Precaution Comments: HOH, very painful LUE with limited AROM Restrictions Weight Bearing Restrictions: Yes RLE Weight  Bearing: Weight bearing as tolerated LLE Weight Bearing: Weight bearing as tolerated General: Amount of Missed PT Time (min): 28 Minutes Missed Time Reason: Patient unwilling/refused to participate without medical reason Pain: Pain Assessment Pain Assessment: No/denies pain Pain Score: 0-No pain Locomotion : Ambulation Ambulation/Gait Assistance: 4: Min assist   See FIM for current functional status  Therapy/Group: Individual Therapy in PM Session and Co-Treatment in AM Session  Chipper HerbBridget S Yaxiel Minnie S. Hayward Rylander, PT, DPT 06/28/2013, 2:26 PM

## 2013-06-29 ENCOUNTER — Inpatient Hospital Stay (HOSPITAL_COMMUNITY): Payer: Medicare Other | Admitting: Physical Therapy

## 2013-06-29 ENCOUNTER — Inpatient Hospital Stay (HOSPITAL_COMMUNITY): Payer: Medicare Other | Admitting: Speech Pathology

## 2013-06-29 ENCOUNTER — Encounter (HOSPITAL_COMMUNITY): Payer: Medicare Other

## 2013-06-29 ENCOUNTER — Inpatient Hospital Stay (HOSPITAL_COMMUNITY): Payer: Medicare Other | Admitting: *Deleted

## 2013-06-29 DIAGNOSIS — S069XAA Unspecified intracranial injury with loss of consciousness status unknown, initial encounter: Secondary | ICD-10-CM

## 2013-06-29 DIAGNOSIS — S069X9A Unspecified intracranial injury with loss of consciousness of unspecified duration, initial encounter: Secondary | ICD-10-CM

## 2013-06-29 MED ORDER — POLYVINYL ALCOHOL 1.4 % OP SOLN
1.0000 [drp] | OPHTHALMIC | Status: DC | PRN
  Filled 2013-06-29: qty 15

## 2013-06-29 NOTE — Progress Notes (Signed)
Physical Therapy Note  Patient Details  Name: Nathaniel Fisher MRN: [id number] Date of Birth: [birth year] Today's Date: 06/29/2013  Time: 1330-1415 45 minutes  1:1 No c/o pain. Treatment session focused on gait training.  Gait in controlled environment with RW with close supervision 100', 60' without LOB, cues for posture.  Stair negotiation 2 x 5 stairs with B handrails with min A for safety.  Ramp/curb negotiation with RW with min A, cues for safety and sequencing.  Pt participated without needing encouragement!   [name] 06/29/2013, 2:16 PM

## 2013-06-29 NOTE — Progress Notes (Addendum)
Occupational Therapy Session Note  Patient Details  Name: Nathaniel Fisher MRN: [id number] Date of Birth: [birth year]  Today's Date: 06/29/2013 Time: 1105-1200 Time Calculation (min): 55min  Short Term Goals: Week 1:  OT Short Term Goal 1 (Week 1): Bath: Mod Assist with LB bath OT Short Term Goal 2 (Week 1): Dressing: Mod Assist UB & LB with AE PRN OT Short Term Goal 3 (Week 1): Toilet Transfer: Mod assist OT Short Term Goal 4 (Week 1): Toileting:  Max assist OT Short Term Goal 5 (Week 1): Grooming:  2/5 tasks at sink in standing  Skilled Therapeutic Interventions/Progress Updates:    Pt resting in recliner upon arrival but agreeable to bathing and dressing with sit<>stand from w/c at sink.  Pt required min encouragement throughout session for sequencing and task initiation.  Pt required min A for sit<>stand at sink to bathe buttocks and pull up pants. Pt required assistance with pulling button up shirt around back and fastening buttons.  Pt was able to tie sweat pants after pulling them up.  Pt required multiple rest breaks throughout session.  Focus on activity tolerance, safety awareness, sit<>stand, funcitonal amb with RW, task initiation, and dynamic standing balance.  Therapy Documentation Precautions:  Precautions Precautions: Fall Precaution Comments: HOH, very painful LUE with limited AROM Restrictions Weight Bearing Restrictions: Yes RLE Weight Bearing: Weight bearing as tolerated LLE Weight Bearing: Weight bearing as tolerated  Pain: Pain Assessment Pain Assessment: No/denies pain Pain Score: 0-No pain  See FIM for current functional status  Therapy/Group: Individual Therapy  Rich BraveLanier, Dak Szumski Chappell 06/29/2013, 12:09 PM

## 2013-06-29 NOTE — Progress Notes (Signed)
Subjective/Complaints: No new complaints. Resting comfortably. Left shoulder still sore when he moves  A 12 point review of systems has been performed and if not noted above is otherwise negative.   Objective: Vital Signs: Blood pressure 106/54, pulse 59, temperature 99 F (37.2 C), temperature source Oral, resp. rate 16, height 5\' 9"  (1.753 m), weight 80 kg (176 lb 5.9 oz), SpO2 96.00%. No results found. No results found for this basename: WBC, HGB, HCT, PLT,  in the last 72 hours No results found for this basename: NA, K, CL, CO, GLUCOSE, BUN, CREATININE, CALCIUM,  in the last 72 hours CBG (last 3)  No results found for this basename: GLUCAP,  in the last 72 hours  Wt Readings from Last 3 Encounters:  06/21/13 80 kg (176 lb 5.9 oz)  06/19/13 56.9 kg (125 lb 7.1 oz)    Physical Exam:  Constitutional: He is oriented to person, place, and not time. He appears well-developed.  Eyes: EOM are normal.  Neck: Normal range of motion. Neck supple. No thyromegaly present.  Cardiovascular: Normal rate and regular rhythm.  Respiratory: Effort normal and breath sounds normal. No respiratory distress.  GI: Soft. Bowel sounds are normal. He exhibits no distension.  Musculoskeletal: + drop arm left side. Left shoulder painful with PROM still--wearing sling Right hip pain with  ROM.   Left leg less tender.  Neurological: He is alert and oriented to person, place, and time. No cranial nerve deficit. Coordination normal.  Patient is hard of hearing. Follows simple commands. Reasonable recall of events surrounding accident.   awareness impaired. LUE limited due to pain/tear. Distally it's 4/5. RUE is grossly 5/5. RLE 2- prox to 4- distally. LLE is 3 to 3+ HF to 4+/5 at ankle Skin:  Multiple facial and upper extremity abrasions and bruising to the forehead which is slowly resolving Psychiatric:  Tangential.  Generally pleasant and cooperative      Assessment/Plan: 1. Functional deficits  secondary to TBI, pelvic fx's, left rotator cuff tear which require 3+ hours per day of interdisciplinary therapy in a comprehensive inpatient rehab setting. Physiatrist is providing close team supervision and 24 hour management of active medical problems listed below. Physiatrist and rehab team continue to assess barriers to discharge/monitor patient progress toward functional and medical goals.  Placement pending   FIM: FIM - Bathing Bathing Steps Patient Completed: Chest;Right Arm;Left Arm;Abdomen;Front perineal area;Buttocks;Left lower leg (including foot);Right lower leg (including foot);Left upper leg;Right upper leg Bathing: 4: Steadying assist  FIM - Upper Body Dressing/Undressing Upper body dressing/undressing steps patient completed: Thread/unthread right sleeve of pullover shirt/dresss;Thread/unthread left sleeve of pullover shirt/dress;Put head through opening of pull over shirt/dress;Pull shirt over trunk;Thread/unthread right sleeve of front closure shirt/dress;Thread/unthread left sleeve of front closure shirt/dress Upper body dressing/undressing: 4: Min-Patient completed 75 plus % of tasks FIM - Lower Body Dressing/Undressing Lower body dressing/undressing steps patient completed: Thread/unthread right pants leg;Thread/unthread left pants leg;Pull pants up/down Lower body dressing/undressing: 3: Mod-Patient completed 50-74% of tasks  FIM - Toileting Toileting: 1: Total-Patient completed zero steps, helper did all 3  FIM - Diplomatic Services operational officerToilet Transfers Toilet Transfers Assistive Devices: Grab bars;Elevated toilet seat Toilet Transfers: 4-To toilet/BSC: Min A (steadying Pt. > 75%);4-From toilet/BSC: Min A (steadying Pt. > 75%)  FIM - Bed/Chair Transfer Bed/Chair Transfer Assistive Devices: Arm rests;Walker Bed/Chair Transfer: 4: Bed > Chair or W/C: Min A (steadying Pt. > 75%)  FIM - Locomotion: Wheelchair Distance: 100 Locomotion: Wheelchair: 0: Activity did not occur FIM -  Locomotion: Ambulation Locomotion:  Ambulation Assistive Devices: Walker - Rolling Ambulation/Gait Assistance: 4: Min assist Locomotion: Ambulation: 1: Travels less than 50 ft with minimal assistance (Pt.>75%)  Comprehension Comprehension Mode: Auditory Comprehension: 4-Understands basic 75 - 89% of the time/requires cueing 10 - 24% of the time  Expression Expression Mode: Verbal Expression: 4-Expresses basic 75 - 89% of the time/requires cueing 10 - 24% of the time. Needs helper to occlude trach/needs to repeat words.  Social Interaction Social Interaction: 3-Interacts appropriately 50 - 74% of the time - May be physically or verbally inappropriate.  Problem Solving Problem Solving: 2-Solves basic 25 - 49% of the time - needs direction more than half the time to initiate, plan or complete simple activities  Memory Memory: 2-Recognizes or recalls 25 - 49% of the time/requires cueing 51 - 75% of the time  Medical Problem List and Plan:  1. TBI/left intraventricular hemorrhage/ right pubic ramus and Ischial fracture--WBAT. Likely left rotator cuff tear.    2.. DVT Prophylaxis/Anticoagulation: SCD's, mobility  3. Pain Management: Oxycodone as needed. Monitor with increased mobility   -added voltaren gel for left shoulder--   -sling   for pain control   -still can consider injection 4. Mood/depression.Zoloft 25mg  daily Provide emotional support.  5. Neuropsych: This patient is not capable of making decisions on His own behalf.  6.Left maxillary sinus fracture. Conservative care  7.Hypertension.Lisinopril 20 mg every a.m. and 10 mg each bedtime. With control.  8.BPH.Flomax 0.4mg  QHS.      LOS (Days) 8 A FACE TO FACE EVALUATION WAS PERFORMED  Cally Nygard T 06/29/2013 8:56 AM

## 2013-06-29 NOTE — Progress Notes (Addendum)
Physical Therapy Weekly Progress Note  Patient Details  Name: Nathaniel Fisher MRN: [id number] Date of Birth: 1925/10/07  Today's Date: 06/29/2013 Time: 9741-6384 Time Calculation (min): 40 min  Patient has met 3 of 3 short term goals. Patient has made slow, steady progress on rehab during first reporting period as demonstrated by decreased assistance levels with all functional mobility. Patient continues to be limited by decreased participation, poor frustration tolerance, poor activity tolerance, and increased irritability during therapy sessions. Verbal orders from MD to decrease daily therapy time to improve participation during sessions. Patient is demonstrating behaviors consistent with [place] Level VI.  Patient continues to demonstrate the following deficits: decreased activity tolerance, decreased balance, decreased postural control, decreased ability to compensate for deficits, decreased attention and awareness, decreased coordination, and decreased knowledge of precautions with poor safety awareness and therefore will continue to benefit from skilled PT intervention to enhance overall performance with activity tolerance, balance, postural control, ability to compensate for deficits, attention, awareness, coordination and knowledge of precautions.  Patient progressing toward long term goals..  Continue plan of care. Downgraded wheelchair mobility goal from 52' with supervision to 40' with supervision secondary to patient's poor activity tolerance.  PT Short Term Goals Week 1:  PT Short Term Goal 1 (Week 1): Patient will perform sit<>supine with modA with HOB flat. PT Short Term Goal 1 - Progress (Week 1): Met PT Short Term Goal 2 (Week 1): Patient will perform bed<>wheelchair transfers with modA. PT Short Term Goal 2 - Progress (Week 1): Met PT Short Term Goal 3 (Week 1): Patient will perform gait training with LRAD x25' with maxA. PT Short Term Goal 3 - Progress (Week 1): Met Week 2:   PT Short Term Goal 1 (Week 2): STGs=LTGs  Skilled Therapeutic Interventions/Progress Updates:    Patient received . Patient missed minutes of therapy secondary to toileting. Remainder of session focused on gait training in controlled and home environments and bed mobility. Patient instructed in gait training 40' x2 (20' each trial on carpet in ADL apartment) with RW and min guard progressing to close supervision. Bed mobility on regular bed in ADL apartment, patient requires supervision for sit<>supine with extra time. Patient returned to room and ambulated short distance in room from wheelchair>recliner. Patient left seated in recliner with all needs within reach.  Therapy Documentation Precautions:  Precautions Precautions: Fall Precaution Comments: HOH, very painful LUE with limited AROM Restrictions Weight Bearing Restrictions: Yes RLE Weight Bearing: Weight bearing as tolerated LLE Weight Bearing: Weight bearing as tolerated General: Amount of Missed PT Time (min): 20 Minutes Missed Time Reason: Other (comment) (toileting) Pain: Pain Assessment Pain Assessment: No/denies pain Pain Score: 0-No pain Locomotion : Ambulation Ambulation/Gait Assistance: 4: Min guard   See FIM for current functional status  Therapy/Group: Individual Therapy  Lillia Abed. Rowen Hur, PT, DPT 06/29/2013, 10:33 AM

## 2013-06-29 NOTE — Plan of Care (Signed)
Problem: RH Ambulation Goal: LTG Patient will ambulate in controlled environment (PT) LTG: Patient will ambulate in a controlled environment, # of feet with assistance (PT).  upgraded 06/29/13 Goal: LTG Patient will ambulate in home environment (PT) LTG: Patient will ambulate in home environment, # of feet with assistance (PT).  upgraded 06/29/13

## 2013-06-29 NOTE — Progress Notes (Signed)
Speech Language Pathology Weekly Progress & Session Notes  Patient Details  Name: Nathaniel Fisher MRN: [id number] Date of Birth: 10-24-1925  Today's Date: 06/29/2013 Time: 0800-0825 Time Calculation (min): 25 min  Short Term Goals: Week 1: SLP Short Term Goal 1 (Week 1): Pt will utilize schedule to recall daily information with Mod A multimodal cueing  SLP Short Term Goal 1 - Progress (Week 1): Not met SLP Short Term Goal 2 (Week 1): Pt will utilize call bell to request assistance with Mod A cues SLP Short Term Goal 2 - Progress (Week 1): Not met SLP Short Term Goal 3 (Week 1): Pt will identify 1 physical and 1 cognitive deficit with Mod A multimodal cues.  SLP Short Term Goal 3 - Progress (Week 1): Not met SLP Short Term Goal 4 (Week 1): Pt will demonstrate funcitonal problem solving for basic and familair tasks with Mod A cues SLP Short Term Goal 4 - Progress (Week 1): Not met SLP Short Term Goal 5 (Week 1): Pt will demonstrate selective attention to a functional task for 15 minutes with Min A cues for redirection SLP Short Term Goal 5 - Progress (Week 1): Met SLP Short Term Goal 6 (Week 1): Pt will maintain topic of conversation for 3 turns with Min A cues.  SLP Short Term Goal 6 - Progress (Week 1): Not met  New Short-Term Goals:  Week 2: SLP Short Term Goal 1 (Week 2): Pt will utilize schedule to recall daily information with Mod A multimodal cueing  SLP Short Term Goal 2 (Week 2): Pt will maintain topic of conversation for 3 turns with Min A cues.  SLP Short Term Goal 3 (Week 2): Pt will identify 1 physical and 1 cognitive deficit with Mod A multimodal cues.  SLP Short Term Goal 4 (Week 2): Pt will utilize call bell to request assistance with Mod A cues SLP Short Term Goal 5 (Week 2): Pt will demonstrate funcitonal problem solving for basic and familair tasks with Mod A cues SLP Short Term Goal 6 (Week 2): Pt will demonstrate selective attention to a functional task for 15 minutes  with supervision cues for redirection  Weekly Progress Updates: Pt has made minimal gains and has met 1 of 6 STG's this reporting period due to improved selective attention. Pt continues to demonstrate intermittent participation and requires encouragement to participate due to decreased cognitive function and awareness into deficits. Pt continues to demonstrate behaviors consistent with a Rancho Level VI and requires Max multimodal cueing functional problem solving, working memory, intellectual awareness into deficits and safety awareness. Pt education ongoing and pt will discharge to a SNF for continued rehabilitation.    SLP Intensity: Minumum of 1-2 x/day, 30 to 90 minutes SLP Frequency: 5 out of 7 days SLP Duration/Estimated Length of Stay: TBD due to SNF placment  SLP Treatment/Interventions: Cueing hierarchy;Cognitive remediation/compensation;Environmental controls;Internal/external aids;Therapeutic Activities;Patient/family education;Functional tasks  Daily Session Skilled Therapeutic Intervention: Treatment focus on cognitive goals. SLP facilitated session by providing Max multimodal cueing for utilization of schedule to anticipate morning therapy events and for utilization of calendar to recall the date. Pt also required Mod multimodal cueing for functional problem solving with tray set-up of breakfast meals. Continue plan of care.  FIM:  Comprehension Comprehension Mode: Auditory Comprehension: 4-Understands basic 75 - 89% of the time/requires cueing 10 - 24% of the time Expression Expression Mode: Verbal Expression: 4-Expresses basic 75 - 89% of the time/requires cueing 10 - 24% of the time. Needs helper to occlude  trach/needs to repeat words. Social Interaction Social Interaction: 4-Interacts appropriately 75 - 89% of the time - Needs redirection for appropriate language or to initiate interaction. Problem Solving Problem Solving: 2-Solves basic 25 - 49% of the time - needs  direction more than half the time to initiate, plan or complete simple activities Memory Memory: 2-Recognizes or recalls 25 - 49% of the time/requires cueing 51 - 75% of the time General    Pain No/Denies Pain   Therapy/Group: Individual Therapy  Kaspar Albornoz 06/29/2013, 4:49 PM

## 2013-06-29 NOTE — Progress Notes (Signed)
Occupational Therapy Weekly Progress Note  Patient Details  Name: Nathaniel Fisher MRN: [id number] Date of Birth: 12/27/1925  Today's Date: 06/29/2013  Patient has met 4 of 5 short term goals.  Pt has made slow and inconsistent progress with BADLs since admission.  Pt requires mod/max encouragement to participate in therapies.  Pt requires multiple rest breaks throughout sessions and frequently becomes frustrated and states "I can't do this."  Pt uses RW to ambulate with RW at min A to bathroom for toileting and to bathe at shower level.  Pt continues to required mod/max verbal cues for safety awareness. Pt's therapy schedule has been decreased to 3 1/2 hrs a day with a verbal order from MD. Pt's d/c plan has changed to SNF due to family unable to provide the level of supervision pt requires at this time.  Patient continues to demonstrate the following deficits: muscle weakness, decreased initiation, decreased awareness, decreased problem solving, decreased safety awareness and decreased memory and decreased standing balance, decreased balance strategies and difficulty maintaining precautions  and therefore will continue to benefit from skilled OT intervention to enhance overall performance with BADL.  Patient progressing toward long term goals..  Continue plan of care.  OT Short Term Goals Week 1:  OT Short Term Goal 1 (Week 1): Bath: Mod Assist with LB bath OT Short Term Goal 1 - Progress (Week 1): Met OT Short Term Goal 2 (Week 1): Dressing: Mod Assist UB & LB with AE PRN OT Short Term Goal 2 - Progress (Week 1): Met OT Short Term Goal 3 (Week 1): Toilet Transfer: Mod assist OT Short Term Goal 3 - Progress (Week 1): Met OT Short Term Goal 4 (Week 1): Toileting:  Max assist OT Short Term Goal 4 - Progress (Week 1): Progressing toward goal OT Short Term Goal 5 (Week 1): Grooming:  2/5 tasks at sink in standing OT Short Term Goal 5 - Progress (Week 1): Met Week 2:  OT Short Term Goal 1 (Week  2): Pt will complete toileting tasks with max A OT Short Term Goal 2 (Week 2): Pt will complete bathing tasks with min A at shower level OT Short Term Goal 3 (Week 2): Pt will complete LB dressing tasks with min A  Skilled Therapeutic Interventions/Progress Updates:      Therapy Documentation Precautions:  Precautions Precautions: Fall Precaution Comments: HOH, very painful LUE with limited AROM Restrictions Weight Bearing Restrictions: Yes RLE Weight Bearing: Weight bearing as tolerated LLE Weight Bearing: Weight bearing as tolerated Pain: Pain Assessment Pain Assessment: No/denies pain  See FIM for current functional status   Leroy Libman 06/29/2013, 2:12 PM

## 2013-06-29 NOTE — Plan of Care (Signed)
Problem: RH Wheelchair Mobility Goal: LTG Patient will propel w/c in controlled environment (PT) LTG: Patient will propel wheelchair in controlled environment, # of feet with assist (PT)  downgraded 06/29/13 due to poor activity tolerance

## 2013-06-30 ENCOUNTER — Inpatient Hospital Stay (HOSPITAL_COMMUNITY): Payer: Medicare Other | Admitting: Occupational Therapy

## 2013-06-30 DIAGNOSIS — S069XAA Unspecified intracranial injury with loss of consciousness status unknown, initial encounter: Secondary | ICD-10-CM

## 2013-06-30 DIAGNOSIS — S069X9A Unspecified intracranial injury with loss of consciousness of unspecified duration, initial encounter: Secondary | ICD-10-CM

## 2013-06-30 NOTE — Progress Notes (Signed)
Occupational Therapy Session Note  Patient Details  Name: Nathaniel Fisher MRN: [id number] Date of Birth: [birth year]  Today's Date: 06/30/2013 Time: 1430-1530 Time Calculation (min): 60 min  Skilled Therapeutic Interventions/Progress Updates: Dtr present for therapy and before session told this clinician, "I want to warn you that he is grouchy and will fuss you out if you ask him to do anything, but he really needs to be pushed;" and "My husband died from a pressure sore; so, I want to be sure that my dad does not get one."  Though patient stated repeatedly, "I just can't do that because my arms won't go up there,"  He concurred to complete bilateral UE AROM and AAROM to work on both maintenance of ROM and strength.  Though patient fussed and stated he was unable to move his arms enough, he participated minimally assisting in folding of his clothing on his lap sitting upright in bed.  Patient was pulled up in bed and repositioned on his right side and with call bell placed within reach at the conclusion of the session.   Dtr stated that she plans to come to hospital on this Monday to speak to patient's rehab doctor for clarity of dad's neurological and cognitive function.      Therapy Documentation Precautions:  Precautions Precautions: Fall Precaution Comments: HOH, very painful LUE with limited AROM Restrictions Weight Bearing Restrictions: Yes RLE Weight Bearing: Weight bearing as tolerated LLE Weight Bearing: Weight bearing as tolerated  Pain: winced while completing L UE shoulder flexion ROM but did not request pain meds   See FIM for current functional status  Therapy/Group: Individual Therapy  Bud Faceickett, Marcela Alatorre [facility] 06/30/2013, 6:20 PM

## 2013-06-30 NOTE — Progress Notes (Signed)
Subjective/Complaints: Hard of hearing  A 12 point review of systems has been performed and if not noted above is otherwise negative.   Objective: Vital Signs: Blood pressure 144/73, pulse 60, temperature 98.5 F (36.9 C), temperature source Oral, resp. rate 20, height 5\' 9"  (1.753 m), weight 80 kg (176 lb 5.9 oz), SpO2 100.00%. No results found. No results found for this basename: WBC, HGB, HCT, PLT,  in the last 72 hours No results found for this basename: NA, K, CL, CO, GLUCOSE, BUN, CREATININE, CALCIUM,  in the last 72 hours CBG (last 3)  No results found for this basename: GLUCAP,  in the last 72 hours  Wt Readings from Last 3 Encounters:  06/21/13 80 kg (176 lb 5.9 oz)  06/19/13 56.9 kg (125 lb 7.1 oz)    Physical Exam:  Constitutional: He is oriented to person, place, and not time. He appears well-developed.  Eyes: EOM are normal.  Neck: Normal range of motion. Neck supple. No thyromegaly present.  Cardiovascular: Normal rate and regular rhythm.  Respiratory: Effort normal and breath sounds normal. No respiratory distress.  GI: Soft. Bowel sounds are normal. He exhibits no distension.  Musculoskeletal: + drop arm left side. Left shoulder painful with PROM still--wearing sling Right hip pain with  ROM.   Left leg less tender.  Neurological: He is alert and oriented to person, place, and time. No cranial nerve deficit. Coordination normal.  Patient is hard of hearing. Follows simple commands. Reasonable recall of events surrounding accident.   awareness impaired. LUE limited due to pain/tear. Distally it's 4/5. RUE is grossly 5/5. RLE 2- prox to 4- distally. LLE is 3 to 3+ HF to 4+/5 at ankle Skin:  Multiple facial and upper extremity abrasions and bruising to the forehead which is slowly resolving Psychiatric:  Tangential.  Generally pleasant and cooperative      Assessment/Plan: 1. Functional deficits secondary to TBI, pelvic fx's, left rotator cuff tear which  require 3+ hours per day of interdisciplinary therapy in a comprehensive inpatient rehab setting. Physiatrist is providing close team supervision and 24 hour management of active medical problems listed below. Physiatrist and rehab team continue to assess barriers to discharge/monitor patient progress toward functional and medical goals.  Placement pending   FIM: FIM - Bathing Bathing Steps Patient Completed: Chest;Right Arm;Left Arm;Abdomen;Front perineal area;Buttocks;Right upper leg;Left upper leg;Left lower leg (including foot) Bathing: 4: Min-Patient completes 8-9 1537f 10 parts or 75+ percent  FIM - Upper Body Dressing/Undressing Upper body dressing/undressing steps patient completed: Thread/unthread right sleeve of pullover shirt/dresss;Thread/unthread left sleeve of pullover shirt/dress;Put head through opening of pull over shirt/dress;Pull shirt over trunk;Thread/unthread right sleeve of front closure shirt/dress;Thread/unthread left sleeve of front closure shirt/dress Upper body dressing/undressing: 4: Min-Patient completed 75 plus % of tasks FIM - Lower Body Dressing/Undressing Lower body dressing/undressing steps patient completed: Thread/unthread right pants leg;Thread/unthread left pants leg;Pull pants up/down Lower body dressing/undressing: 3: Mod-Patient completed 50-74% of tasks  FIM - Toileting Toileting: 1: Total-Patient completed zero steps, helper did all 3  FIM - Diplomatic Services operational officerToilet Transfers Toilet Transfers Assistive Devices: Grab bars;Elevated toilet seat Toilet Transfers: 4-To toilet/BSC: Min A (steadying Pt. > 75%);4-From toilet/BSC: Min A (steadying Pt. > 75%)  FIM - Bed/Chair Transfer Bed/Chair Transfer Assistive Devices: Arm rests;Walker Bed/Chair Transfer: 4: Bed > Chair or W/C: Min A (steadying Pt. > 75%);4: Chair or W/C > Bed: Min A (steadying Pt. > 75%);5: Supine > Sit: Supervision (verbal cues/safety issues);5: Sit > Supine: Supervision (verbal cues/safety  issues)  FIM - Locomotion: Wheelchair Distance: 100 Locomotion: Wheelchair: 1: Total Assistance/staff pushes wheelchair (Pt<25%) FIM - Locomotion: Ambulation Locomotion: Ambulation Assistive Devices: Designer, industrial/product Ambulation/Gait Assistance: 4: Min guard Locomotion: Ambulation: 2: Travels 50 - 149 ft with minimal assistance (Pt.>75%)  Comprehension Comprehension Mode: Auditory Comprehension: 4-Understands basic 75 - 89% of the time/requires cueing 10 - 24% of the time  Expression Expression Mode: Verbal Expression: 4-Expresses basic 75 - 89% of the time/requires cueing 10 - 24% of the time. Needs helper to occlude trach/needs to repeat words.  Social Interaction Social Interaction: 3-Interacts appropriately 50 - 74% of the time - May be physically or verbally inappropriate.  Problem Solving Problem Solving: 2-Solves basic 25 - 49% of the time - needs direction more than half the time to initiate, plan or complete simple activities  Memory Memory: 2-Recognizes or recalls 25 - 49% of the time/requires cueing 51 - 75% of the time  Medical Problem List and Plan:  1. TBI/left intraventricular hemorrhage/ right pubic ramus and Ischial fracture--WBAT. Likely left rotator cuff tear.    2.. DVT Prophylaxis/Anticoagulation: SCD's, mobility  3. Pain Management: Oxycodone as needed. Monitor with increased mobility   -added voltaren gel for left shoulder--   -sling   for pain control   -still can consider injection 4. Mood/depression.Zoloft 25mg  daily Provide emotional support.  5. Neuropsych: This patient is not capable of making decisions on His own behalf.  6.Left maxillary sinus fracture. Conservative care  7.Hypertension.Lisinopril 20 mg every a.m. and 10 mg each bedtime. With control.  8.BPH.Flomax 0.4mg  QHS.      LOS (Days) 9 A FACE TO FACE EVALUATION WAS PERFORMED  Nathaniel Fisher E 06/30/2013 8:07 AM

## 2013-07-01 NOTE — Progress Notes (Signed)
Subjective/Complaints: Hard of hearing "want to go back to [place]" Good BM this am A 12 point review of systems has been performed and if not noted above is otherwise negative.   Objective: Vital Signs: Blood pressure 126/73, pulse 71, temperature 98.1 F (36.7 C), temperature source Oral, resp. rate 18, height 5\' 9"  (1.753 m), weight 80 kg (176 lb 5.9 oz), SpO2 97.00%. No results found. No results found for this basename: WBC, HGB, HCT, PLT,  in the last 72 hours No results found for this basename: NA, K, CL, CO, GLUCOSE, BUN, CREATININE, CALCIUM,  in the last 72 hours CBG (last 3)  No results found for this basename: GLUCAP,  in the last 72 hours  Wt Readings from Last 3 Encounters:  06/21/13 80 kg (176 lb 5.9 oz)  06/19/13 56.9 kg (125 lb 7.1 oz)    Physical Exam:  Constitutional: He is oriented to person, place, and not time. He appears well-developed.  Eyes: EOM are normal.  Neck: Normal range of motion. Neck supple. No thyromegaly present.  Cardiovascular: Normal rate and regular rhythm.  Respiratory: Effort normal and breath sounds normal. No respiratory distress.  GI: Soft. Bowel sounds are normal. He exhibits no distension.  Musculoskeletal: + drop arm left side. Left shoulder painful with PROM still--wearing sling Right hip pain with decreased active  ROM.   Left leg no pai with ROM Neurological: He is alert and oriented to person, place, and time. No cranial nerve deficit. Coordination normal.  Patient is hard of hearing. Follows simple commands. Reasonable recall of events surrounding accident.   awareness impaired. LUE limited due to pain/tear. Distally it's 4/5. RUE is grossly 5/5. RLE 2- prox to 4- distally. LLE is 3 to 3+ HF to 4+/5 at ankle Skin:  Multiple facial and upper extremity abrasions and bruising to the forehead which is slowly resolving Psychiatric:  Tangential.  Generally pleasant and cooperative      Assessment/Plan: 1. Functional deficits  secondary to TBI, pelvic fx's, left rotator cuff tear which require 3+ hours per day of interdisciplinary therapy in a comprehensive inpatient rehab setting. Physiatrist is providing close team supervision and 24 hour management of active medical problems listed below. Physiatrist and rehab team continue to assess barriers to discharge/monitor patient progress toward functional and medical goals.  Placement pending   FIM: FIM - Bathing Bathing Steps Patient Completed: Chest;Right Arm;Left Arm;Abdomen;Front perineal area;Buttocks;Right upper leg;Left upper leg;Left lower leg (including foot) Bathing: 4: Min-Patient completes 8-9 6930f 10 parts or 75+ percent  FIM - Upper Body Dressing/Undressing Upper body dressing/undressing steps patient completed: Thread/unthread right sleeve of pullover shirt/dresss;Thread/unthread left sleeve of pullover shirt/dress;Put head through opening of pull over shirt/dress;Pull shirt over trunk;Thread/unthread right sleeve of front closure shirt/dress;Thread/unthread left sleeve of front closure shirt/dress Upper body dressing/undressing: 4: Min-Patient completed 75 plus % of tasks FIM - Lower Body Dressing/Undressing Lower body dressing/undressing steps patient completed: Thread/unthread right pants leg;Thread/unthread left pants leg;Pull pants up/down Lower body dressing/undressing: 3: Mod-Patient completed 50-74% of tasks  FIM - Toileting Toileting: 1: Total-Patient completed zero steps, helper did all 3  FIM - Diplomatic Services operational officerToilet Transfers Toilet Transfers Assistive Devices: Grab bars;Elevated toilet seat Toilet Transfers: 4-To toilet/BSC: Min A (steadying Pt. > 75%);4-From toilet/BSC: Min A (steadying Pt. > 75%)  FIM - Bed/Chair Transfer Bed/Chair Transfer Assistive Devices: Arm rests;Walker Bed/Chair Transfer: 4: Bed > Chair or W/C: Min A (steadying Pt. > 75%);4: Chair or W/C > Bed: Min A (steadying Pt. > 75%);5: Supine > Sit:  Supervision (verbal cues/safety issues);5:  Sit > Supine: Supervision (verbal cues/safety issues)  FIM - Locomotion: Wheelchair Distance: 100 Locomotion: Wheelchair: 1: Total Assistance/staff pushes wheelchair (Pt<25%) FIM - Locomotion: Ambulation Locomotion: Ambulation Assistive Devices: Designer, industrial/product Ambulation/Gait Assistance: 4: Min guard Locomotion: Ambulation: 2: Travels 50 - 149 ft with minimal assistance (Pt.>75%)  Comprehension Comprehension Mode: Auditory Comprehension: 4-Understands basic 75 - 89% of the time/requires cueing 10 - 24% of the time  Expression Expression Mode: Verbal Expression: 4-Expresses basic 75 - 89% of the time/requires cueing 10 - 24% of the time. Needs helper to occlude trach/needs to repeat words.  Social Interaction Social Interaction: 3-Interacts appropriately 50 - 74% of the time - May be physically or verbally inappropriate.  Problem Solving Problem Solving: 2-Solves basic 25 - 49% of the time - needs direction more than half the time to initiate, plan or complete simple activities  Memory Memory: 2-Recognizes or recalls 25 - 49% of the time/requires cueing 51 - 75% of the time  Medical Problem List and Plan:  1. TBI/left intraventricular hemorrhage/ right pubic ramus and Ischial fracture--WBAT. Likely left rotator cuff tear.    2.. DVT Prophylaxis/Anticoagulation: SCD's, mobility  3. Pain Management: Oxycodone as needed. Monitor with increased mobility   -added voltaren gel for left shoulder--   -sling   for pain control   -still can consider injection 4. Mood/depression.Zoloft 25mg  daily Provide emotional support.  5. Neuropsych: This patient is not capable of making decisions on His own behalf.  6.Left maxillary sinus fracture. Conservative care  7.Hypertension.Lisinopril 20 mg every a.m. and 10 mg each bedtime. With control.  8.BPH.Flomax 0.4mg  QHS.      LOS (Days) 10 A FACE TO FACE EVALUATION WAS PERFORMED  Erick Colace 07/01/2013 9:48 AM

## 2013-07-02 ENCOUNTER — Encounter (HOSPITAL_COMMUNITY): Payer: Medicare Other | Admitting: Occupational Therapy

## 2013-07-02 ENCOUNTER — Inpatient Hospital Stay (HOSPITAL_COMMUNITY): Payer: Medicare Other | Admitting: *Deleted

## 2013-07-02 ENCOUNTER — Inpatient Hospital Stay (HOSPITAL_COMMUNITY): Payer: Medicare Other | Admitting: Speech Pathology

## 2013-07-02 LAB — BASIC METABOLIC PANEL
BUN: 17 mg/dL (ref 6–23)
CO2: 19 mEq/L (ref 19–32)
Calcium: 8.4 mg/dL (ref 8.4–10.5)
Chloride: 98 mEq/L (ref 96–112)
Creatinine, Ser: 1.01 mg/dL (ref 0.50–1.35)
GFR calc Af Amer: 76 mL/min — ABNORMAL LOW (ref >90)
GFR, EST NON AFRICAN AMERICAN: 66 mL/min — AB (ref >90)
Glucose, Bld: 126 mg/dL — ABNORMAL HIGH (ref 70–99)
Potassium: 4.5 mEq/L (ref 3.7–5.3)
Sodium: 133 mEq/L — ABNORMAL LOW (ref 137–147)

## 2013-07-02 MED ORDER — TAMSULOSIN HCL 0.4 MG PO CAPS
0.4000 mg | ORAL_CAPSULE | Freq: Every day | ORAL | Status: DC
  Filled 2013-07-02: qty 1

## 2013-07-02 MED ORDER — LISINOPRIL 20 MG PO TABS
20.0000 mg | ORAL_TABLET | Freq: Every day | ORAL | Status: DC
  Administered 2013-07-03: 20 mg via ORAL
  Filled 2013-07-02 (×2): qty 1

## 2013-07-02 NOTE — Plan of Care (Signed)
Problem: RH Car Transfers Goal: LTG Patient will perform car transfers with assist (PT) LTG: Patient will perform car transfers with assistance (PT).  Outcome: Not Applicable Date Met:  07/02/13 Not applicable secondary to d/c to SNF     
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
## 2013-07-02 NOTE — Discharge Summary (Signed)
Discharge summary job # [id number]

## 2013-07-02 NOTE — Progress Notes (Signed)
Physical Therapy Discharge Summary  Patient Details  Name: Nathaniel Fisher MRN: [id number] Date of Birth: Oct 31, 1925  Today's Date: 07/02/2013  Patient has met 11 of 12 long term goals due to improved activity tolerance, improved balance, improved postural control, increased strength, increased range of motion, decreased pain, ability to compensate for deficits, improved attention, improved awareness and improved coordination.  Patient to discharge at an ambulatory level Supervision.   Patient's care partner not necessary secondary to patient discharging to SNF to provide the necessary cognitive assistance at discharge.  Reasons goals not met: Patient did not meet car transfer goal secondary to discharge to SNF and goal no longer applicable.  Recommendation:  Patient will benefit from ongoing skilled PT services in skilled nursing facility setting to continue to advance safe functional mobility, address ongoing impairments in strength, balance, coordination, attention, awareness, gait, and minimize fall risk.  Equipment: No equipment provided  Reasons for discharge: treatment goals met and discharge from hospital  Patient/family agrees with progress made and goals achieved: Yes  PT Discharge Precautions/Restrictions Precautions Precautions: Fall Precaution Comments: HOH, at times limited UE range  Restrictions Weight Bearing Restrictions: No RLE Weight Bearing: Weight bearing as tolerated LLE Weight Bearing: Weight bearing as tolerated Vision/Perception  Vision - History Baseline Vision: Wears glasses all the time Patient Visual Report: No change from baseline Perception Perception: Within Functional Limits Praxis Praxis: Intact  Cognition Overall Cognitive Status: Impaired/Different from baseline Arousal/Alertness: Awake/alert Orientation Level: Oriented X4 Attention: Focused;Sustained Focused Attention: Appears intact Sustained Attention: Appears intact Memory:  Impaired Memory Impairment: Decreased recall of new information;Decreased short term memory Decreased Short Term Memory: Verbal basic;Functional basic Awareness: Impaired Awareness Impairment: Emergent impairment Problem Solving: Impaired Problem Solving Impairment: Verbal basic;Functional basic Safety/Judgment: Impaired [employer] Scales of Cognitive Functioning: Automatic/appropriate Sensation Sensation Light Touch: Appears Intact Stereognosis: Appears Intact Hot/Cold: Appears Intact Proprioception: Appears Intact Coordination Fine Motor Movements are Fluid and Coordinated: Yes Coordination and Movement Description: Patient with good coordination > BUEs, somewhat limited due to decreased shoulder AROM Motor  Motor Motor: Within Functional Limits Motor - Discharge Observations: improved overall mobility due to decr pain  Mobility Bed Mobility Bed Mobility: Supine to Sit;Sit to Supine Supine to Sit: 5: Supervision;HOB flat Supine to Sit Details: Verbal cues for sequencing;Verbal cues for precautions/safety Sit to Supine: 5: Supervision;HOB flat Sit to Supine - Details: Verbal cues for sequencing;Verbal cues for precautions/safety Transfers Transfers: Yes Sit to Stand: 5: Supervision;With armrests;With upper extremity assist;From chair/3-in-1 Sit to Stand Details: Verbal cues for technique;Verbal cues for precautions/safety Stand to Sit: 5: Supervision;To chair/3-in-1;With upper extremity assist;With armrests Stand to Sit Details (indicate cue type and reason): Verbal cues for technique;Verbal cues for precautions/safety Stand Pivot Transfers: 5: Supervision;With armrests Stand Pivot Transfer Details: Verbal cues for technique;Verbal cues for precautions/safety Locomotion  Ambulation Ambulation: Yes Ambulation/Gait Assistance: 5: Supervision Ambulation Distance (Feet): 160 Feet Assistive device: Rolling walker Ambulation/Gait Assistance Details: Verbal cues for  precautions/safety;Verbal cues for safe use of DME/AE;Verbal cues for gait pattern Gait Gait: Yes Gait Pattern: Impaired Gait Pattern: Step-through pattern;Shuffle;Decreased hip/knee flexion - right;Decreased hip/knee flexion - left;Decreased dorsiflexion - right;Decreased dorsiflexion - left;Decreased stride length;Narrow base of support;Trunk flexed;Decreased trunk rotation Stairs / Additional Locomotion Stairs: Yes Stairs Assistance: 4: Min assist Stairs Assistance Details: Verbal cues for sequencing;Verbal cues for technique;Verbal cues for precautions/safety Stair Management Technique: Two rails;Step to pattern;Forwards Number of Stairs: 10 Height of Stairs: 6 Wheelchair Mobility Wheelchair Mobility: Yes Wheelchair Assistance: 5: Investment banker, operational Details: Tactile cues  for initiation;Verbal cues for precautions/safety;Verbal cues for technique Wheelchair Propulsion: Both lower extermities Wheelchair Parts Management: Needs assistance Distance: 160  Trunk/Postural Assessment  Cervical Assessment Cervical Assessment: Within Functional Limits (forward head) Thoracic Assessment Thoracic Assessment: Within Functional Limits (kyphotic) Lumbar Assessment Lumbar Assessment: Within Functional Limits Postural Control Postural Control: Within Functional Limits  Balance Balance Balance Assessed: Yes Static Sitting Balance Static Sitting - Balance Support: Feet supported;No upper extremity supported Static Sitting - Level of Assistance: 6: Modified independent (Device/Increase time) Static Standing Balance Static Standing - Balance Support: Bilateral upper extremity supported Static Standing - Level of Assistance: 5: Stand by assistance Dynamic Standing Balance Dynamic Standing - Level of Assistance: 4: Min assist Extremity Assessment  RUE AROM (degrees) RUE Overall AROM Comments: Patient with decreased active shoulder movements due to premorid status per his  daughters report "rotator cuff tear"; elbow > distally active range of motion is [facility] RUE Strength RUE Overall Strength Comments: strength is limited in shoulder due to premorbid status, including pain LUE AROM (degrees) LUE Overall AROM Comments: 0-100 RLE Assessment RLE Assessment: Exceptions to [facility] RLE Strength RLE Overall Strength: Deficits;Due to pain RLE Overall Strength Comments: Grossly 3- to 3/5 LLE Assessment LLE Assessment: Exceptions to [facility] LLE Strength LLE Overall Strength: Deficits;Due to pain LLE Overall Strength Comments: Grossly 3- to 3/5  See FIM for current functional status  Wandell Scullion S Ripley Bogosian S. Kweli Grassel, PT, DPT 07/02/2013, 4:07 PM

## 2013-07-02 NOTE — Progress Notes (Signed)
Patient with SBP<100 again. Will set parameters on BP meds. Check lytes. Change flomax to HS to avoid orthostasis.

## 2013-07-02 NOTE — Progress Notes (Signed)
Social Work Patient ID: [name], male   DOB: 1925/10/01, 80 y.o.   MRN: [id number]  [place] SN[place] bed offer this afternoon [facility] [place].  Pt, son and daughter aware and have accepted bed with plan to d/c tomorrow (via ambulance).  Treatment team made aware.  Shunte Senseney, LCSW
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
## 2013-07-02 NOTE — Progress Notes (Signed)
Speech Language Pathology Daily Session Note  Patient Details  Name: Nathaniel Fisher MRN: [id number] Date of Birth: [birth year]  Today's Date: 07/02/2013 Time: [id number] Time Calculation (min): 40 min  Short Term Goals: Week 2: SLP Short Term Goal 1 (Week 2): Pt will utilize schedule to recall daily information with Mod A multimodal cueing  SLP Short Term Goal 2 (Week 2): Pt will maintain topic of conversation for 3 turns with Min A cues.  SLP Short Term Goal 3 (Week 2): Pt will identify 1 physical and 1 cognitive deficit with Mod A multimodal cues.  SLP Short Term Goal 4 (Week 2): Pt will utilize call bell to request assistance with Mod A cues SLP Short Term Goal 5 (Week 2): Pt will demonstrate funcitonal problem solving for basic and familair tasks with Mod A cues SLP Short Term Goal 6 (Week 2): Pt will demonstrate selective attention to a functional task for 15 minutes with supervision cues for redirection  Skilled Therapeutic Interventions: Skilled treatment session focused on cognitive goals. SLP facilitated session by providing supervision question cues for functional problem solving for utilizing call bell for pain medication.  Pt sustained attention to functional task for ~30 minutes with supervision verbal cues for redirection and demonstrated increased social interaction and overall participation in treatment session. Pt also demonstrated increased frustration tolerance. Continue plan of care.    FIM:  Comprehension Comprehension Mode: Auditory Comprehension: 5-Understands basic 90% of the time/requires cueing < 10% of the time Expression Expression Mode: Verbal Expression: 5-Expresses basic 90% of the time/requires cueing < 10% of the time. Social Interaction Social Interaction: 5-Interacts appropriately 90% of the time - Needs monitoring or encouragement for participation or interaction. Problem Solving Problem Solving: 3-Solves basic 50 - 74% of the time/requires cueing 25 -  49% of the time Memory Memory: 2-Recognizes or recalls 25 - 49% of the time/requires cueing 51 - 75% of the time  Pain Pain in buttocks, RN made aware, pt repositioned, medication given, cream applied   Therapy/Group: Individual Therapy  Yuta Cipollone 07/02/2013, 5:10 PM

## 2013-07-02 NOTE — Progress Notes (Addendum)
Recreational Therapy Session Note  Patient Details  Name: Nathaniel Fisher MRN: [id number] Date of Birth: 09/12/1925 Today's Date: 07/02/2013  Pain: no c/o Skilled Therapeutic Interventions/Progress Updates: Session focused on activity tolerance, ambulation with RW, dynamic sitting balnace, UE use/strengthening, problem solving, & selective attention in moderately distractive environment.  aPT ambulate from room to gym using RW with min assist.  Pt sat EOM using pvc pipe to bat a ball with close supervision.  Pt completed pipe tree puzzle with min cues for problem solving.  Nathaniel Fisher 07/02/2013, 4:04 PM

## 2013-07-02 NOTE — Progress Notes (Signed)
2Occupational Therapy Discharge Summary  Patient Details  Name: Nathaniel Fisher MRN: [id number] Date of Birth: Aug 29, 1925  Today's Date: 07/02/2013 Time: 1030-1130 Time Calculation (min): 60 min  Patient has met 10 of 11 long term goals due to improved activity tolerance, improved balance, postural control, functional use of  RIGHT upper, RIGHT lower, LEFT upper and LEFT lower extremity, improved attention, improved awareness and increased activity tolerance, decr pain and increased balance.  Patient to discharge at [facility] Assist level.  Patient's care partner is independent to provide the necessary physical and cognitive assistance at discharge.    Reasons goals not met: Pt still requires min to mod A for awareness and memory.   Recommendation:  Patient will benefit from ongoing skilled OT services in skilled nursing facility setting to continue to advance functional skills in the area of BADL and Reduce care partner burden.  Equipment: No equipment provided  Reasons for discharge: discharge from hospital  Patient/family agrees with progress made and goals achieved: Yes  OT Discharge Precautions/Restrictions  Precautions Precautions: Fall Precaution Comments: HOH, at times limited UE range  Restrictions Weight Bearing Restrictions: No RLE Weight Bearing: Weight bearing as tolerated LLE Weight Bearing: Weight bearing as tolerated    Pain Pain Assessment Pain Assessment: No/denies pain Pain Score: 0-No pain ADL  see FIM Vision/Perception  Vision - History Baseline Vision: Wears glasses all the time Patient Visual Report: No change from baseline Perception Perception: Within Functional Limits Praxis Praxis: Intact  Cognition Overall Cognitive Status: Impaired/Different from baseline Arousal/Alertness: Awake/alert Orientation Level: Oriented X4 Attention: Focused;Sustained Focused Attention: Appears intact Sustained Attention: Appears intact Memory:  Impaired Memory Impairment: Decreased recall of new information;Decreased short term memory Decreased Short Term Memory: Verbal basic;Functional basic Awareness: Impaired Awareness Impairment: Emergent impairment Problem Solving: Impaired Problem Solving Impairment: Verbal basic;Functional basic Safety/Judgment: Impaired [place] [employer] Scales of Cognitive Functioning: Automatic/appropriate Sensation Sensation Light Touch: Appears Intact Stereognosis: Appears Intact Hot/Cold: Appears Intact Proprioception: Appears Intact Coordination Fine Motor Movements are Fluid and Coordinated: Yes Coordination and Movement Description: Patient with good coordination > BUEs, somewhat limited due to decreased shoulder AROM Motor  Motor Motor: Within Functional Limits Motor - Discharge Observations: improved overall mobility due to decr pain Mobility  Bed Mobility Supine to Sit: 5: Supervision Transfers Transfers: Sit to Stand;Stand to Sit Sit to Stand: 5: Supervision Stand to Sit: 5: Supervision  Trunk/Postural Assessment  Cervical Assessment Cervical Assessment: Within Functional Limits (forward head position) Thoracic Assessment Thoracic Assessment: Within Functional Limits (kyphotic)  Balance Balance Balance Assessed: Yes Static Sitting Balance Static Sitting - Level of Assistance: 6: Modified independent (Device/Increase time) Static Standing Balance Static Standing - Level of Assistance: 5: Stand by assistance Dynamic Standing Balance Dynamic Standing - Level of Assistance: 4: Min assist Extremity/Trunk Assessment RUE AROM (degrees) RUE Overall AROM Comments: Patient with decreased active shoulder movements due to premorid status per his daughters report "rotator cuff tear"; elbow > distally active range of motion is [facility] RUE Strength RUE Overall Strength Comments: strength is limited in shoulder due to premorbid status, including pain LUE AROM (degrees) LUE Overall AROM  Comments: 0-100  See FIM for current functional status  Nathaniel Fisher [facility] 07/02/2013, 3:39 PM

## 2013-07-02 NOTE — Progress Notes (Signed)
Physical Therapy Session Note  Patient Details  Name: Nathaniel Fisher MRN: [id number] Date of Birth: 1925-10-16  Today's Date: 07/02/2013 Time: 1130-1200 and 1400-1445 Time Calculation (min): 30 min and 45 min  Short Term Goals: Week 2:  PT Short Term Goal 1 (Week 2): STGs=LTGs  Skilled Therapeutic Interventions/Progress Updates:    AM Session: Patient received sitting in recliner. Session focused on functional transfers, gait training, wheelchair mobility, and stair negotiation. Patient performed gait training 160' x1 with RW and min guard, patient does not respond well to cues for RW use or gait. Patient negotiated 10 steps with B handrails and min A. Wheelchair mobility with B LEs x160' and supervision. Patient left sitting in recliner with all needs within reach and daughter present. Patient continues to get irritated throughout session and demonstrates poor frustration tolerance with activities.  PM Session: Patient received sitting in recliner. Session focused on functional transfers, gait training, and cognitive remediation with sequencing/organization task. Patient performed gait training 160' x2 with RW and supervision. Sitting balance task with bimanual integration with PVC pipe hitting ball. Sitting cognitive task with pipe tree to improve sequencing, organization, and problem solving with min cues. Patient left seated in recliner with all needs within reach.  Therapy Documentation Precautions:  Precautions Precautions: Fall Precaution Comments: HOH, very painful LUE with limited AROM Restrictions Weight Bearing Restrictions: Yes RLE Weight Bearing: Weight bearing as tolerated LLE Weight Bearing: Weight bearing as tolerated General: Amount of Missed PT Time (min): 15 Minutes Missed Time Reason: Patient unwilling/refused to participate without medical reason Pain: Pain Assessment Pain Assessment: No/denies pain Pain Score: 0-No pain Pain Type: Acute pain Pain Location:  Sacrum Pain Descriptors / Indicators: Aching Pain Frequency: Constant Pain Onset: On-going Patients Stated Pain Goal: 3 Pain Intervention(s): Medication (See eMAR);Repositioned;Emotional support Multiple Pain Sites: No Locomotion : Ambulation Ambulation/Gait Assistance: 4: Min guard Wheelchair Mobility Distance: 150   See FIM for current functional status  Therapy/Group: Individual Therapy  Chipper HerbBridget S Jerry Haugen S. Jadah Bobak, PT, DPT 07/02/2013, 2:53 PM

## 2013-07-03 DIAGNOSIS — S069XAA Unspecified intracranial injury with loss of consciousness status unknown, initial encounter: Secondary | ICD-10-CM

## 2013-07-03 DIAGNOSIS — S069X9A Unspecified intracranial injury with loss of consciousness of unspecified duration, initial encounter: Secondary | ICD-10-CM

## 2013-07-03 NOTE — Progress Notes (Signed)
Social Work  Discharge Note  The overall goal for the admission was met for:   Discharge location: Yes - dc to SNF which was plan upon admission eval  Length of Stay: Yes - 12 days  Discharge activity level: Yes - minimal assistance overall  Home/community participation: Yes  Services provided included: MD, RD, PT, OT, SLP, RN, TR, Pharmacy and SW  Financial Services:  Medicare and [facility] [facility]  Follow-up services arranged: Other: SNF @ [facility]  Comments (or additional information):  Patient/Family verbalized understanding of follow-up arrangements: Yes  Individual responsible for coordination of the follow-up plan: pt and son  Confirmed correct DME delivered: NA    Nathaniel Fisher

## 2013-07-03 NOTE — Progress Notes (Signed)
Speech Language Pathology Discharge Summary  Patient Details  Name: Nathaniel Fisher MRN: [id number] Date of Birth: August 22, 1925  Today's Date: 07/03/2013  Patient has met 3 of 4 long term goals.  Patient to discharge at overall Mod;Max level.   Reasons goals not met: Pt continues to require Max A for recall of new and functional information    Clinical Impression/Discharge Summary: Pt has made functional gains and has met 3 of 4 LTG's this admission due to improved functional problem solving, attention and awareness. Pt requires overall Mod A for safety with functional tasks but continues to require Max A for recall of new and functional information. Overall, pt continues to demonstrate behaviors consistent with a Rancho Level VII. Pt is also consuming Dys. 3 textures per his preference due to pt not wearing his dentures at this time.  Pt requires 24 hour supervision and pt's family is unable to provide this at this time, therefore, pt will discharge to SNF. Pt would benefit from f/u skilled SLP intervention to maximize cognitive function and overall functional independence.   Care Partner:  Caregiver Able to Provide Assistance: No  Type of Caregiver Assistance: Physical;Cognitive  Recommendation:  Skilled Nursing facility  Rationale for SLP Follow Up: Maximize cognitive function and independence;Reduce caregiver burden   Equipment: N/A   Reasons for discharge: Discharged from hospital   Patient/Family Agrees with Progress Made and Goals Achieved: Yes   See FIM for current functional status  Jolicia Delira 07/03/2013, 7:31 AM

## 2013-07-03 NOTE — Progress Notes (Signed)
Recreational Therapy Discharge Summary Patient Details  Name: Loris Winrow MRN: [id number] Date of Birth: Feb 14, 1926 Today's Date: 07/03/2013  Long term goals set: 1  Long term goals met: 1  Comments on progress toward goals: Pt has made good progress toward goal and is ready for discharge to SNF today for 24 hour care & continued therapies.  Pt is close supervision to min assist for simple TR tasks and requires min cues for cognition and/or encouragement. Reasons for discharge: discharge from hospital  Follow-up: Encourage participation in recreational therapy and/or activities program at [facility]  Patient/family agrees with progress made and goals achieved: Yes  Melyna Huron 07/03/2013, 8:07 AM

## 2013-07-03 NOTE — Progress Notes (Signed)
Subjective/Complaints: Ready to go to new rehab place. Denies new problems. Feels that he's made a lot of progress with [place] here on inpatient rehab.  A 12 point review of systems has been performed and if not noted above is otherwise negative.   Objective: Vital Signs: Blood pressure 104/56, pulse 59, temperature 98 F (36.7 C), temperature source Oral, resp. rate 19, height 5\' 9"  (1.753 m), weight 80 kg (176 lb 5.9 oz), SpO2 96.00%. No results found. No results found for this basename: WBC, HGB, HCT, PLT,  in the last 72 hours  Recent Labs  07/02/13 1245  NA 133*  K 4.5  CL 98  GLUCOSE 126*  BUN 17  CREATININE 1.01  CALCIUM 8.4   CBG (last 3)  No results found for this basename: GLUCAP,  in the last 72 hours  Wt Readings from Last 3 Encounters:  06/21/13 80 kg (176 lb 5.9 oz)  06/19/13 56.9 kg (125 lb 7.1 oz)    Physical Exam:  Constitutional: He is oriented to person, place, and not time. He appears well-developed.  Eyes: EOM are normal.  Neck: Normal range of motion. Neck supple. No thyromegaly present.  Cardiovascular: Normal rate and regular rhythm.  Respiratory: Effort normal and breath sounds normal. No respiratory distress.  GI: Soft. Bowel sounds are normal. He exhibits no distension.  Musculoskeletal: + drop arm left side. Left shoulder much less painful with PROM  Right hip pain with decreased active  ROM.   Left leg no pain with ROM Neurological: He is alert and oriented to person, place, and time. No cranial nerve deficit. Coordination normal.  Patient is hard of hearing. Follows simple commands. Reasonable recall of events surrounding accident.   awareness impaired. LUE limited due to pain/tear. Distally it's 4/5. RUE is grossly 5/5. RLE 2- prox to 4- distally. LLE is 3 to 3+ HF to 4+/5 at ankle Skin:  Multiple facial and upper extremity abrasions and bruising to the forehead which appear much improved Psychiatric:  Tangential.  Generally pleasant and  cooperative      Assessment/Plan: 1. Functional deficits secondary to TBI, pelvic fx's, left rotator cuff tear which require 3+ hours per day of interdisciplinary therapy in a comprehensive inpatient rehab setting. Physiatrist is providing close team supervision and 24 hour management of active medical problems listed below. Physiatrist and rehab team continue to assess barriers to discharge/monitor patient progress toward functional and medical goals.  Placement at SNF today   FIM: FIM - Bathing Bathing Steps Patient Completed: Chest;Right Arm;Left Arm;Abdomen;Front perineal area;Right upper leg;Left upper leg;Right lower leg (including foot);Left lower leg (including foot) Bathing: 4: Min-Patient completes 8-9 7f 10 parts or 75+ percent  FIM - Upper Body Dressing/Undressing Upper body dressing/undressing steps patient completed: Thread/unthread right sleeve of pullover shirt/dresss;Thread/unthread left sleeve of pullover shirt/dress;Put head through opening of pull over shirt/dress;Pull shirt over trunk Upper body dressing/undressing: 5: Supervision: Safety issues/verbal cues FIM - Lower Body Dressing/Undressing Lower body dressing/undressing steps patient completed: Thread/unthread right pants leg;Thread/unthread left pants leg;Pull pants up/down;Thread/unthread right underwear leg;Thread/unthread left underwear leg;Pull underwear up/down;Don/Doff left sock;Don/Doff right shoe;Don/Doff left shoe;Fasten/unfasten right shoe;Fasten/unfasten left shoe Lower body dressing/undressing: 4: Min-Patient completed 75 plus % of tasks  FIM - Toileting Toileting steps completed by patient: Adjust clothing prior to toileting;Performs perineal hygiene;Adjust clothing after toileting Toileting: 4: Steadying assist  FIM - Diplomatic Services operational officer Devices: Grab bars;Walker Toilet Transfers: 5-To toilet/BSC: Supervision (verbal cues/safety issues);5-From toilet/BSC: Supervision  (verbal cues/safety issues)  FIM -  Bed/Chair Transport plannerTransfer Bed/Chair Transfer Assistive Devices: Environmental consultantWalker;Arm rests Bed/Chair Transfer: 5: Chair or W/C > Bed: Supervision (verbal cues/safety issues);5: Bed > Chair or W/C: Supervision (verbal cues/safety issues)  FIM - Locomotion: Wheelchair Distance: 160 Locomotion: Wheelchair: 5: Travels 150 ft or more: maneuvers on rugs and over door sills with supervision, cueing or coaxing FIM - Locomotion: Ambulation Locomotion: Ambulation Assistive Devices: Designer, industrial/productWalker - Rolling Ambulation/Gait Assistance: 5: Supervision Locomotion: Ambulation: 5: Travels 150 ft or more with supervision/safety issues  Comprehension Comprehension Mode: Auditory Comprehension: 5-Understands basic 90% of the time/requires cueing < 10% of the time  Expression Expression Mode: Verbal Expression: 5-Expresses basic 90% of the time/requires cueing < 10% of the time.  Social Interaction Social Interaction: 5-Interacts appropriately 90% of the time - Needs monitoring or encouragement for participation or interaction.  Problem Solving Problem Solving: 3-Solves basic 50 - 74% of the time/requires cueing 25 - 49% of the time  Memory Memory: 2-Recognizes or recalls 25 - 49% of the time/requires cueing 51 - 75% of the time  Medical Problem List and Plan:  1. TBI/left intraventricular hemorrhage/ right pubic ramus and Ischial fracture--WBAT. Likely left rotator cuff tear.    2.. DVT Prophylaxis/Anticoagulation: SCD's, mobility  3. Pain Management: Oxycodone as needed. Monitor with increased mobility   -added voltaren gel for left shoulder--   -sling   for pain control, generally less tender. Has torn rotator cuff 4. Mood/depression.Zoloft 25mg  daily Provide emotional support.  5. Neuropsych: This patient is not capable of making decisions on His own behalf.  6.Left maxillary sinus fracture. Conservative care  7.Hypertension.Lisinopril 20 mg every a.m. and 10 mg each  bedtime--normotensive  8.BPH.Flomax 0.4mg  QHS.      LOS (Days) 12 A FACE TO FACE EVALUATION WAS PERFORMED  Kyian Obst T 07/03/2013 8:29 AM

## 2013-07-03 NOTE — Progress Notes (Signed)
Pt discharged to SNF with personal belonging at 1303. Reported called to [facility]Deena, receiving nurse at facility. Pt transported off unit in stretcher by [facility] transporters.

## 2013-07-03 NOTE — Plan of Care (Signed)
Problem: RH BLADDER ELIMINATION Goal: RH STG MANAGE BLADDER WITH ASSISTANCE STG Manage Bladder With minimal Assistance  Outcome: Not Progressing Utilizing condom cath at discharge

## 2013-07-03 NOTE — Plan of Care (Signed)
Problem: RH BLADDER ELIMINATION Goal: RH STG MANAGE BLADDER WITH ASSISTANCE STG Manage Bladder With minimal Assistance  Outcome: Not Met (add Reason) Using condom cath at discharge

## 2013-07-04 DIAGNOSIS — F329 Major depressive disorder, single episode, unspecified: Secondary | ICD-10-CM

## 2013-07-04 DIAGNOSIS — F3289 Other specified depressive episodes: Secondary | ICD-10-CM

## 2013-07-04 DIAGNOSIS — I82409 Acute embolism and thrombosis of unspecified deep veins of unspecified lower extremity: Secondary | ICD-10-CM

## 2013-07-04 DIAGNOSIS — S06300A Unspecified focal traumatic brain injury without loss of consciousness, initial encounter: Secondary | ICD-10-CM

## 2013-07-04 DIAGNOSIS — S069X0A Unspecified intracranial injury without loss of consciousness, initial encounter: Secondary | ICD-10-CM

## 2013-07-04 DIAGNOSIS — IMO0001 Reserved for inherently not codable concepts without codable children: Secondary | ICD-10-CM

## 2013-07-04 DIAGNOSIS — N400 Enlarged prostate without lower urinary tract symptoms: Secondary | ICD-10-CM

## 2013-08-08 ENCOUNTER — Encounter: Payer: Medicare Other | Admitting: Physical Medicine & Rehabilitation

## 2013-09-21 ENCOUNTER — Other Ambulatory Visit: Payer: Self-pay | Admitting: Internal Medicine

## 2013-09-22 ENCOUNTER — Other Ambulatory Visit: Payer: Self-pay | Admitting: Internal Medicine

## 2013-09-25 ENCOUNTER — Other Ambulatory Visit: Payer: Self-pay | Admitting: Internal Medicine

## 2013-10-12 ENCOUNTER — Ambulatory Visit: Payer: Medicare Other | Admitting: Family Medicine

## 2013-11-15 ENCOUNTER — Other Ambulatory Visit: Payer: Self-pay | Admitting: Internal Medicine

## 2014-01-18 ENCOUNTER — Other Ambulatory Visit: Payer: Self-pay | Admitting: Internal Medicine

## 2014-01-21 ENCOUNTER — Other Ambulatory Visit: Payer: Self-pay | Admitting: Internal Medicine

## 2014-09-24 ENCOUNTER — Ambulatory Visit
Admit: 2014-09-24 | Disposition: A | Discharge status: Home / Self Care | Payer: Self-pay | Attending: Family Medicine | Admitting: Family Medicine

## 2014-09-24 LAB — CBC WITH DIFFERENTIAL/PLATELET
BASOS PCT: 1.3 %
Basophil #: 0.1 10*3/uL (ref 0.0–0.1)
EOS PCT: 1.5 %
Eosinophil #: 0.2 10*3/uL (ref 0.0–0.7)
HCT: 41.2 % (ref 40.0–52.0)
HGB: 13.2 g/dL (ref 13.0–18.0)
Lymphocyte #: 2.8 10*3/uL (ref 1.0–3.6)
Lymphocyte %: 27.8 %
MCH: 27.4 pg (ref 26.0–34.0)
MCHC: 32 g/dL (ref 32.0–36.0)
MCV: 86 fL (ref 80–100)
MONO ABS: 0.7 x10 3/mm (ref 0.2–1.0)
Monocyte %: 6.7 %
Neutrophil #: 6.3 10*3/uL (ref 1.4–6.5)
Neutrophil %: 62.7 %
Platelet: 239 10*3/uL (ref 150–440)
RBC: 4.8 10*6/uL (ref 4.40–5.90)
RDW: 14.3 % (ref 11.5–14.5)
WBC: 10.1 10*3/uL (ref 3.8–10.6)

## 2014-09-24 LAB — BASIC METABOLIC PANEL
ANION GAP: 7 (ref 7–16)
BUN: 9 mg/dL
CALCIUM: 8.8 mg/dL — AB
Chloride: 105 mmol/L
Co2: 29 mmol/L
Creatinine: 1.08 mg/dL
EGFR (Non-African Amer.): >60
Glucose: 95 mg/dL
Potassium: 3.6 mmol/L
Sodium: 141 mmol/L

## 2014-09-24 LAB — APTT: ACTIVATED PTT: 28.1 s (ref 23.6–35.9)

## 2014-09-24 LAB — PROTIME-INR
INR: 1
Prothrombin Time: 13.3 secs

## 2014-09-30 ENCOUNTER — Ambulatory Visit
Admit: 2014-09-30 | Disposition: A | Discharge status: Home / Self Care | Payer: Self-pay | Attending: Cardiology | Admitting: Cardiology

## 2014-10-01 ENCOUNTER — Inpatient Hospital Stay
Admit: 2014-10-01 | Disposition: A | Discharge status: Skilled Nursing Facility | Payer: Self-pay | Attending: Internal Medicine | Admitting: Internal Medicine

## 2014-10-01 LAB — BASIC METABOLIC PANEL
Anion Gap: 9 (ref 7–16)
BUN: 9 mg/dL
CREATININE: 0.74 mg/dL
Calcium, Total: 9.5 mg/dL
Chloride: 105 mmol/L
Co2: 29 mmol/L
EGFR (African American): >60
EGFR (Non-African Amer.): >60
Glucose: 104 mg/dL — ABNORMAL HIGH
POTASSIUM: 3.7 mmol/L
Sodium: 143 mmol/L

## 2014-10-01 LAB — CBC WITH DIFFERENTIAL/PLATELET
Basophil #: 0 10*3/uL (ref 0.0–0.1)
Basophil %: 0.4 %
EOS PCT: 0.4 %
Eosinophil #: 0 10*3/uL (ref 0.0–0.7)
HCT: 39.8 % — AB (ref 40.0–52.0)
HGB: 13 g/dL (ref 13.0–18.0)
LYMPHS ABS: 2 10*3/uL (ref 1.0–3.6)
Lymphocyte %: 25.1 %
MCH: 29.1 pg (ref 26.0–34.0)
MCHC: 32.6 g/dL (ref 32.0–36.0)
MCV: 89 fL (ref 80–100)
MONOS PCT: 6.1 %
Monocyte #: 0.5 x10 3/mm (ref 0.2–1.0)
NEUTROS ABS: 5.3 10*3/uL (ref 1.4–6.5)
Neutrophil %: 68 %
Platelet: 214 10*3/uL (ref 150–440)
RBC: 4.46 10*6/uL (ref 4.40–5.90)
RDW: 13.9 % (ref 11.5–14.5)
WBC: 7.8 10*3/uL (ref 3.8–10.6)

## 2014-10-01 LAB — TROPONIN I

## 2014-10-02 LAB — TROPONIN I
Troponin-I: 0.09 ng/mL — ABNORMAL HIGH
Troponin-I: 0.09 ng/mL — ABNORMAL HIGH

## 2014-10-02 LAB — BASIC METABOLIC PANEL
ANION GAP: 4 — AB (ref 7–16)
BUN: 9 mg/dL
CALCIUM: 8.3 mg/dL — AB
CREATININE: 0.83 mg/dL
Chloride: 108 mmol/L
Co2: 27 mmol/L
EGFR (African American): >60
EGFR (Non-African Amer.): >60
GLUCOSE: 120 mg/dL — AB
POTASSIUM: 3.3 mmol/L — AB
Sodium: 139 mmol/L

## 2014-10-02 LAB — CBC WITH DIFFERENTIAL/PLATELET
BASOS ABS: 0 10*3/uL (ref 0.0–0.1)
BASOS ABS: 0.1 10*3/uL (ref 0.0–0.1)
Basophil %: 0.2 %
Basophil %: 0.7 %
EOS ABS: 0 10*3/uL (ref 0.0–0.7)
Eosinophil #: 0 10*3/uL (ref 0.0–0.7)
Eosinophil %: 0.3 %
Eosinophil %: 0.3 %
HCT: 38.4 % — ABNORMAL LOW (ref 40.0–52.0)
HCT: 40.9 % (ref 40.0–52.0)
HGB: 12.6 g/dL — ABNORMAL LOW (ref 13.0–18.0)
HGB: 13.6 g/dL (ref 13.0–18.0)
LYMPHS PCT: 22.5 %
Lymphocyte #: 1.7 10*3/uL (ref 1.0–3.6)
Lymphocyte #: 1.8 10*3/uL (ref 1.0–3.6)
Lymphocyte %: 18.8 %
MCH: 28 pg (ref 26.0–34.0)
MCH: 28.4 pg (ref 26.0–34.0)
MCHC: 32.7 g/dL (ref 32.0–36.0)
MCHC: 33.3 g/dL (ref 32.0–36.0)
MCV: 86 fL (ref 80–100)
MCV: 85 fL (ref 80–100)
MONO ABS: 0.7 x10 3/mm (ref 0.2–1.0)
MONOS PCT: 9 %
MONOS PCT: 7.7 %
Monocyte #: 0.7 x10 3/mm (ref 0.2–1.0)
NEUTROS ABS: 6.9 10*3/uL — AB (ref 1.4–6.5)
Neutrophil #: 5.3 10*3/uL (ref 1.4–6.5)
Neutrophil %: 68 %
Neutrophil %: 72.5 %
PLATELETS: 159 10*3/uL (ref 150–440)
PLATELETS: 161 10*3/uL (ref 150–440)
RBC: 4.49 10*6/uL (ref 4.40–5.90)
RBC: 4.79 10*6/uL (ref 4.40–5.90)
RDW: 14.5 % (ref 11.5–14.5)
RDW: 14.5 % (ref 11.5–14.5)
WBC: 7.8 10*3/uL (ref 3.8–10.6)
WBC: 9.6 10*3/uL (ref 3.8–10.6)

## 2014-10-02 LAB — PROTIME-INR
INR: 1.2
Prothrombin Time: 15.2 secs — ABNORMAL HIGH

## 2014-10-02 LAB — HEPARIN LEVEL (UNFRACTIONATED): ANTI-XA(UNFRACTIONATED): 0.17 [IU]/mL — AB (ref 0.30–0.70)

## 2014-10-02 LAB — MAGNESIUM: Magnesium: 1.8 mg/dL

## 2014-10-02 LAB — URINALYSIS, COMPLETE
Bilirubin,UR: NEGATIVE
Blood: NEGATIVE
Glucose,UR: NEGATIVE mg/dL (ref 0–75)
Ketone: NEGATIVE
Leukocyte Esterase: NEGATIVE
Nitrite: NEGATIVE
Ph: 7 (ref 4.5–8.0)
Protein: NEGATIVE
Specific Gravity: 1.009 (ref 1.003–1.030)
Squamous Epithelial: NONE SEEN

## 2014-10-02 LAB — APTT: ACTIVATED PTT: 30.6 s (ref 23.6–35.9)

## 2014-10-03 LAB — CBC WITH DIFFERENTIAL/PLATELET
Basophil #: 0 10*3/uL (ref 0.0–0.1)
Basophil %: 0.2 %
EOS PCT: 0.1 %
Eosinophil #: 0 10*3/uL (ref 0.0–0.7)
HCT: 39.1 % — ABNORMAL LOW (ref 40.0–52.0)
HGB: 12.8 g/dL — ABNORMAL LOW (ref 13.0–18.0)
Lymphocyte #: 1.9 10*3/uL (ref 1.0–3.6)
Lymphocyte %: 19.2 %
MCH: 27.9 pg (ref 26.0–34.0)
MCHC: 32.7 g/dL (ref 32.0–36.0)
MCV: 85 fL (ref 80–100)
MONOS PCT: 10.8 %
Monocyte #: 1.1 x10 3/mm — ABNORMAL HIGH (ref 0.2–1.0)
NEUTROS ABS: 6.8 10*3/uL — AB (ref 1.4–6.5)
Neutrophil %: 69.7 %
Platelet: 148 10*3/uL — ABNORMAL LOW (ref 150–440)
RBC: 4.59 10*6/uL (ref 4.40–5.90)
RDW: 14.5 % (ref 11.5–14.5)
WBC: 9.8 10*3/uL (ref 3.8–10.6)

## 2014-10-03 LAB — BASIC METABOLIC PANEL
Anion Gap: 9 (ref 7–16)
BUN: 9 mg/dL
CALCIUM: 8.3 mg/dL — AB
CHLORIDE: 104 mmol/L
CO2: 23 mmol/L
Creatinine: 0.78 mg/dL
EGFR (African American): >60
EGFR (Non-African Amer.): >60
Glucose: 104 mg/dL — ABNORMAL HIGH
Potassium: 3.3 mmol/L — ABNORMAL LOW
Sodium: 136 mmol/L

## 2014-10-04 ENCOUNTER — Encounter
Admit: 2014-10-04 | Disposition: A | Discharge status: Home / Self Care | Payer: Self-pay | Attending: Internal Medicine | Admitting: Internal Medicine

## 2014-10-04 LAB — CBC WITH DIFFERENTIAL/PLATELET
BASOS ABS: 0.1 10*3/uL (ref 0.0–0.1)
Basophil %: 0.5 %
Eosinophil #: 0 10*3/uL (ref 0.0–0.7)
Eosinophil %: 0.1 %
HCT: 40.7 % (ref 40.0–52.0)
HGB: 13.5 g/dL (ref 13.0–18.0)
LYMPHS ABS: 1.5 10*3/uL (ref 1.0–3.6)
Lymphocyte %: 14 %
MCH: 28.1 pg (ref 26.0–34.0)
MCHC: 33.2 g/dL (ref 32.0–36.0)
MCV: 85 fL (ref 80–100)
MONOS PCT: 10.7 %
Monocyte #: 1.1 x10 3/mm — ABNORMAL HIGH (ref 0.2–1.0)
NEUTROS ABS: 7.9 10*3/uL — AB (ref 1.4–6.5)
Neutrophil %: 74.7 %
Platelet: 150 10*3/uL (ref 150–440)
RBC: 4.8 10*6/uL (ref 4.40–5.90)
RDW: 14.3 % (ref 11.5–14.5)
WBC: 10.6 10*3/uL (ref 3.8–10.6)

## 2014-10-04 LAB — BASIC METABOLIC PANEL
Anion Gap: 11 (ref 7–16)
BUN: 11 mg/dL
CREATININE: 0.79 mg/dL
Calcium, Total: 8.4 mg/dL — ABNORMAL LOW
Chloride: 102 mmol/L
Co2: 22 mmol/L
EGFR (African American): >60
Glucose: 110 mg/dL — ABNORMAL HIGH
Potassium: 3.5 mmol/L
Sodium: 135 mmol/L

## 2014-10-04 NOTE — H&P (Signed)
PATIENT NAME:  Nathaniel Fisher, Nathaniel Fisher MR#:  [id number] DATE OF BIRTH:  [birth year]  DATE OF ADMISSION:  12/28/2012  PRIMARY CARE PHYSICIAN:  Dr. Reuel BoomHande, [facility]    REFERRING PHYSICIAN:  Dr. Maurilio LovelyNoelle McLaurin  CHIEF COMPLAINT:  Chest pressure.  HISTORY OF PRESENT ILLNESS:  The patient is an 81 year old Caucasian male with history of hypertension, hyperlipidemia, history of stroke, who comes in with the above chief complaint. The patient, of note, had a hospitalization in the middle of April for chest pressure, and got discharged after acute coronary syndrome was ruled out and underwent outpatient treadmill stress test which, per him, was negative. He has been having chest pressure for the past 3 to 4 days off and on. They could come on with rest or activity. He really cannot explain the sensation, but states it runs up and down his abdomen as well, but it is chest pressure. It does not radiate to the jaw or the neck. He has shortness of breath with it, and occasional dizziness with this. He states his blood pressure today was high. Here, he was noted to have significantly elevated blood pressure on arrival, 188/78, and pulse rate in the 40s and 50s. Of note, he does have EKG showing heart rates in the 50s, recent admission as well. Troponin was negative, and hospitalist services were contacted for further evaluation and management.   PAST MEDICAL HISTORY:  Seizure disorder, no longer on antiseizure medications, history of stroke, hypertension, hyperlipidemia, history of sinus arrhythmia/bradycardia.   ALLERGIES:  None.  SOCIAL HISTORY:  Still chews tobacco regularly for multiple years. Occasional alcohol. No drug use. Lives by himself. Is active, and works around the house.   FAMILY HISTORY:  Brother died with heart attack.   PAST SURGICAL HISTORY:  Cholecystectomy and inguinal hernia repair.   OUTPATIENT MEDICATIONS:  Aspirin 325 mg daily, acetaminophen 650 mg every 4 hours as needed for pain,  lisinopril 20 mg daily and 1/2 tab in the evening, Lovastatin 20 mg daily, Spectravite multivitamin with minerals 1 tab once a day, vitamin B12, 500 mcg daily.   REVIEW OF SYSTEMS:   CONSTITUTIONAL:  Denies fever, fatigue, weakness.  EYES:  No blurry vision or double vision.  EARS, NOSE, THROAT:  Decreased hearing. No tinnitus or ear pain.  RESPIRATORY:  No cough. Occasional shortness of breath. No wheezing or hemoptysis.  CARDIOVASCULAR:  Chest pressure with occasional shortness of breath with this history of arrhythmia in the past.  GASTROINTESTINAL:  No nausea, vomiting, diarrhea, abdominal pain.  GENITOURINARY:  Denies dysuria or hematuria.  HEMATOLOGIC/LYMPHATIC:  Denies anemia or easy bruising.  ENDOCRINE:  Denies polyuria or nocturia.  SKIN:  No rashes.  MUSCULOSKELETAL:  Denies arthritis or gout.  NEUROLOGIC:  Denies focal weakness or numbness.  PSYCHIATRIC:  Denies anxiety or insomnia.   PHYSICAL EXAMINATION: VITAL SIGNS: Temperature on arrival 97.8, pulse rate 44, respiratory rate 18, initial blood pressure 188/78, O2 sat was 98% on room air.  GENERAL:  The patient is a well-developed Caucasian male lying in bed in no obvious distress.  HEENT: Normocephalic, atraumatic. Pupils equal and reactive. Anicteric sclerae. Extraocular muscles intact. Moist mucous membranes.  NECK:  Supple. No thyroid tenderness. No cervical lymphadenopathy.  CARDIOVASCULAR:  S1, S2. Bradycardic. No significant murmurs, rubs or gallops.  LUNGS:  Clear to auscultation without wheezing, rhonchi or rales.  ABDOMEN:  Soft, nontender, nondistended. Positive bowel sounds in all quadrants.  EXTREMITIES:  No significant lower extremity edema.  SKIN:  No obvious rashes.  NEUROLOGIC:  Cranial nerves II through XII grossly intact. Strength is 5/5 all extremities.  PSYCHIATRIC:  Awake, alert, oriented x 3.   SIGNIFICANT LABS AND IMAGING:  EKG shows sinus bradycardia, rate of 52, Q waves in V1 and V2. No acute ST  elevations or depression, no significant changes from prior EKGs. Nonspecific interventricular conduction delay with QRS duration of 122 milliseconds.   Glucose 105, BUN 13, creatinine 1.09, sodium 140, potassium 4.1, chloride 106. Troponin negative. CBC within normal limits.   ASSESSMENT AND PLAN:  We have an 81 year old male with history of hypertension, seizure disorder, hyperlipidemia, history of stroke, with recent hospitalization in April for chest pressure, and acute coronary syndrome was ruled out. The patient had workup as an outpatient, including a treadmill stress test, and comes in for chest pressure and some shortness of breath. Of note, he has a negative troponin and no chest pain or pressure now. Will admit the patient to the hospital for observation and rule out acute coronary syndrome. Would order a Lexiscan stress test for tomorrow, and if the patient has ruled out for acute coronary syndrome, that could be stopped and he could perhaps undergo a cardiac cath but, this point, that will be ordered. He also, of note, has sinus bradycardia, and his cardiologist is aware of this. Of note, in the last hospitalization there is no evidence of atrial fibrillation, as they thought that he potentially might have had sick sinus syndrome with bradycardia, but he did not have any tachycardia. Would start him on aspirin, statin, cycle the troponins, check a lipid profile, and admit him to telemetry with monitoring of his heart rhythm. Will start him on Lovenox for deep vein thrombosis prophylaxis and a proton pump inhibitor for gastrointestinal prophylaxis.   The patient is a FULL CODE.   Total time spent is 50 minutes.    ____________________________ Krystal Eaton, MD sa:mr D: 12/28/2012 17:55:50 ET T: 12/28/2012 19:01:09 ET JOB#: [id number]  cc: Krystal Eaton, MD, <Dictator> Barbette Reichmann, MD  Krystal Eaton MD ELECTRONICALLY SIGNED 01/14/2013 11:33

## 2014-10-04 NOTE — Consult Note (Signed)
General Aspect 81 year old male with history of insignificant CAD by catheterization in 2008, hypertension, early sick sinus syndrom,e hyperlipidemia that presented to the ER after 2 days of feeling slightly anxious in his chest with some discomfort in the epigastric area that would feel better after eating something.  He went to the fire station to be checked and he was told to go to the emergency room for further evaluation. There was some question of A. fib,  hence he was admitted.  Review of telemetry does not show any evidence of A. fib.  His admission EKG did show sinus bradycardia with  with flattened P waves, PCs,  butt no A. fib.  Telemetry has shown bradycardia with PACs and sinus arrhythmia again no evidence o f Atrial fibrillation.  Since patient is in the hospital he has had no further chest discomfort.  It is noted that patient chews tobacco which may have contributed to some esophagitis or gastritis.  He is really anxious to go home because he wants to get a fix of some tobacco.  He expresses no desire to stop.  The stimulants in the tobacco may also be contributing to the palpitations.  He does not get any exertional chest pain.  Some mild shortness of breath.Cardiac enzymes are negative. No PND orthopnea.  The daughter pulled me aside and said someone that he was very close to recently died and she believes he may been having some anxiety related to that.   Physical Exam:  GEN well developed, no acute distress   HEENT pink conjunctivae   RESP normal resp effort   CARD Bradycardic  No murmur  PAC's   ABD denies tenderness  soft   EXTR negative edema   SKIN skin turgor decreased   NEURO cranial nerves intact, motor/sensory function intact   PSYCH alert, A+O to time, place, person   Review of Systems:  Subjective/Chief Complaint I feel fine and want to go home   Respiratory: Short of breath  Mild   Cardiovascular: Palpitations   Gastrointestinal: Pain more in epigastric  area resolved with eating   Review of Systems: All other systems were reviewed and found to be negative   Medications/Allergies Reviewed Medications/Allergies reviewed   Lab Results: Routine Chem:  17-Apr-14 02:12   Result Comment TROPONIN - RESULTS VERIFIED BY REPEAT TESTING.  - TPL  Result(s) reported on 28 Sep 2012 at 03:04AM.  Cardiac:  17-Apr-14 02:12   Troponin I 0.05 (0.00-0.05 0.05 ng/mL or less: NEGATIVE  Repeat testing in 3-6 hrs  if clinically indicated. >0.05 ng/mL: POTENTIAL  MYOCARDIAL INJURY. Repeat  testing in 3-6 hrs if  clinically indicated. NOTE: An increase or decrease  of 30% or more on serial  testing suggests a  clinically important change)   Radiology Results: XRay:    16-Apr-14 17:33, Chest PA and Lateral  Chest PA and Lateral   REASON FOR EXAM:    Chest Pain  COMMENTS:       PROCEDURE: DXR - DXR CHEST PA (OR AP) AND LATERAL  - Sep 27 2012  5:33PM     RESULT: There is some tenting of the left side of the diaphragm laterally   which was not is prominent on the previous study.The lungs are clear.   The heart and pulmonary vessels are normal. The bony and mediastinal   structures are unremarkable.    IMPRESSION:  Tenting laterally in the left side of the diaphragm slightly   greater than seen previously. No other  significant finding.    Dictation Site: 6    Verified By: Elveria RoyalsGEOFFREY H. BROWNE, M.D., MD    NKA: None  Vital Signs/Nurse's Notes: **Vital Signs.:   17-Apr-14 04:22  Vital Signs Type Routine  Temperature Temperature (F) 98.3  Celsius 36.8  Temperature Source oral  Pulse Pulse 49  Respirations Respirations 18  Systolic BP Systolic BP 120  Diastolic BP (mmHg) Diastolic BP (mmHg) 55  Mean BP 76  Pulse Ox % Pulse Ox % 96  Pulse Ox Activity Level  At rest  Oxygen Delivery Room Air/ 21 %    Impression 81 year old male with history of insignificant CAD,Hypertension, early sick sinus syndrome somewhat atypical chest discomfort  that improved after eating without any evidence of A. fib now feeling well and wanting to go home.   Plan 1. Discuss with Dr. Gwen PoundsKowalski and he will go by shortly and probably discharge patient to home today with close followup next week.  At that time will be decided if patient does need a stress test.  He has been told to be nonexertional until we follow him up in the office. 2.  He was encouraged to stop using oral tobacco possibly due to contribution of stimulants to palpitations and /or Esophagitis/gastritis.Patient shows no interest in stopping tobacco use. 3.  He will continue his lisinopril for treatment of hypertension. 4.  We will continue to watch closely on an outpatient basis bradycardia, early sick sinus syndrome and any need for pacemaker. Currently he seems to be asymptomatic from bradycardia. Avoid use of beta blockers or calcium channel blockers.  Patient was seen collaboration with Dr. Gwen PoundsKowalski.   Electronic Signatures: Rudi Cocoarroll, Donna (NP)  (Signed 17-Apr-14 13:26)  Authored: General Aspect/Present Illness, History and Physical Exam, Review of System, Labs, Radiology, Allergies, Vital Signs/Nurse's Notes, Impression/Plan   Last Updated: 17-Apr-14 13:26 by Rudi Cocoarroll, Donna (NP)

## 2014-10-04 NOTE — Discharge Summary (Signed)
PATIENT NAME:  Nathaniel Fisher, Nathaniel Fisher MR#:  [id number] DATE OF BIRTH:  09-02-1925  DATE OF ADMISSION:  09/27/2012 DATE OF DISCHARGE:  09/28/2012  DIAGNOSES AT TIME OF DISCHARGE: 1.  Chest pressure and shortness of breath, myocardial infarction ruled out.  2.  Sinus bradycardia, possible early sick sinus syndrome.  3.  Hypertension.  4.  Hyperlipidemia.   CHIEF COMPLAINT: Chest pain, shortness of breath.   HISTORY OF PRESENT ILLNESS: The patient is an 81 year old male with a history of seizures, CVA, hypertension, who was admitted for possible cardiac arrhythmia concerning for atrial fibrillation, although his EKG showed evidence of sinus arrhythmia with bradycardia and a few PVCs. The patient, who lives alone, complained of chest pressure along with palpitations, skipped beats, who was seen at the local fire department where his blood pressure was elevated. He also has been complaining of weakness.   PAST MEDICAL HISTORY: Significant for seizure disorder, history of previous cerebrovascular accident, hypertension, hyperlipidemia.   PAST SURGICAL HISTORY: Significant for cholecystectomy, inguinal hernia repair.   PHYSICAL EXAMINATION: VITAL SIGNS: He was afebrile, temperature 97.7, heart rate is 52, respirations 16, blood pressure 133/63, oxygen saturation 95% on room air.  GENERAL: He was not in distress.  HEENT: Normocephalic, atraumatic.  NECK: Supple.  HEART: S1, S2.  LUNGS: Clear.  ABDOMEN: Soft, nontender.  EXTREMITIES: No edema.  NEUROLOGIC: Nonfocal.   LABORATORY, DIAGNOSTIC AND RADIOLOGIC DATA:  EKG shows sinus arrhythmia with a ventricular rate of 55 beats per minute. No ST-T changes.   HOSPITAL COURSE: [facility]. He was seen by cardiologist, Dr. Gwen PoundsKowalski in consultation. It was felt that he could have early sick sinus syndrome, did not have any evidence of atrial fibrillation. He was advised to stop using tobacco products and continue his lisinopril for his  hypertension. The patient was ambulated and did not have any further episodes of chest discomfort. His cardiac enzymes including troponins were negative. D-dimer was also negative at less than 0.22.  He was discharged in stable condition on the following medications: Lovastatin 20 mg once a day, lisinopril 20 mg once a day, aspirin 325 mg once a day, vitamin B12 500 mcg once a day, docusate sodium 1 tablet Fisher.o. b.i.d. Fisher.r.n. he has been advised to follow-up with me, Dr. Marcello FennelHande, and also follow-up with Dr. Gwen PoundsKowalski, cardiologist, in 1 to 2 weeks' time. He was advised to call back if any questions or concerns.   Total time spent in discharge of patient; 30 minutes  ____________________________ Barbette ReichmannVishwanath Naiomy Watters, MD vh:cc D: 10/04/2012 18:10:33 ET T: 10/04/2012 18:59:58 ET JOB#: [id number]  cc: Barbette ReichmannVishwanath Karinna Beadles, MD, <Dictator> Barbette ReichmannVISHWANATH Jahnessa Vanduyn MD ELECTRONICALLY SIGNED 10/05/2012 13:12

## 2014-10-04 NOTE — H&P (Signed)
PATIENT NAME:  Nathaniel Fisher, Nathaniel Fisher MR#:  [id number] DATE OF BIRTH:  03-25-1926  DATE OF ADMISSION:  09/27/2012  PRIMARY CARE PHYSICIAN: Barbette Reichmann, MD  REQUESTING PHYSICIAN:  Bayard Males, MD   CHIEF COMPLAINT: Chest pain and shortness of breath.   HISTORY OF PRESENT ILLNESS: The patient is an 81 year old male with a known history of seizure, CVA, hypertension, is being admitted for possible cardiac arrhythmias. ED was concerned for new onset a-fib, although on EKG it seems to me the patient has a sinus arrhythmia as I was able to trace P waves.  The patient lives alone, and his son was concerned as the patient keeps complaining of some intermittent chest pressure along with skipped beats, so he took him to the local fire department yesterday where his blood pressure was found to be elevated. They also told him that his heart rate was high, for which his son brought him to the Emergency Department today, as they called his family doctor who advised him to come to the ED.  While in the ED, his  monitor  rhythm was thought to be atrial fibrillation, but on EKG it seems like he does have some P waves, so this is likely sinus arrhythmia. He does have some skipped waves.  He is being admitted for further evaluation and management. He denies any chest pain at this time.   PAST MEDICAL HISTORY: 1. Seizure disorder.  2. History of cerebrovascular accident.  3. Hypertension.  4. Hyperlipidemia.   ALLERGIES: No known drug allergies.   MEDICATIONS AT HOME: 1. Vitamin B12 500 mcg p.o. daily.  2. Lovastatin  20 mg p.o. daily.  3. Lisinopril 20 mg p.o. daily.  4. Aspirin 325 mg p.o. daily.   SOCIAL HISTORY: He chews tobacco regularly for a long time.  Occasional alcohol. No IV drugs of abuse. He lives alone. His son lives about 30 minutes away, and he has a good neighbor who checks on him.   FAMILY HISTORY:  One brother, who died of heart attack.   PAST SURGICAL HISTORY: 1. Cholecystectomy.   2. Inguinal hernia repair.   REVIEW OF SYSTEMS: CONSTITUTIONAL: No fever, fatigue, weakness.  EYES: No blurred or double vision.  ENT: Decreased hearing, does not wear hearing aid at this time. No tinnitus or ear pain.  RESPIRATORY: No cough, wheezing, hemoptysis.  CARDIOVASCULAR: Positive for intermittent chest pressure and some shortness of breath, also some heart arrhythmias for which he was considered for a pacemaker about a year ago by his cardiologist, Dr. Gwen Pounds, but that had not been followed through subsequently.  GASTROINTESTINAL: No nausea, vomiting, diarrhea.  GENITOURINARY: No dysuria or hematuria.  ENDOCRINE: No polyuria or nocturia.  HEMATOLOGY: No anemia or easy bruising.  SKIN: No rash or lesion.  MUSCULOSKELETAL: No arthritis or muscle cramp.  NEUROLOGIC: No tingling, numbness or weakness.  PSYCHIATRIC: No history of anxiety or depression.   PHYSICAL EXAMINATION: VITAL SIGNS: Temperature 97.7, heart rate 52 per minute, respirations 16 per minute, blood pressure 133/63 mmHg. He is saturating 95% on room air.  GENERAL: The patient is an 81 year old male lying in the bed comfortably without any acute distress.  HEENT: Eyes: Pupils are equal, round, reactive to light and accommodation. No scleral icterus.  Extraocular muscles are intact. Head atraumatic, normocephalic.  Oropharynx and nasopharynx are clear.  NECK: Supple, no jugular venous distention. No thyroid enlargement or tenderness.  LUNGS: Clear to auscultation bilaterally. No wheezing, rales, rhonchi or crepitation.  CARDIOVASCULAR: S1, S2 normal.  No  murmurs, rubs or gallops.  ABDOMEN: Soft, nontender, nondistended. Bowel sounds present. No organomegaly or masses.  EXTREMITIES: No pedal edema, cyanosis, clubbing.  NEUROLOGICAL: Cranial nerves III through XII are intact. Muscle strength 5 out of 5 in all extremities. Sensation is intact. Gait not checked as she is having a lot of pain with severe frontal headache,  8 out of 10, and does not feel like getting off the bed.  PSYCHIATRIC: The patient is to time, place and person x 3.  SKIN:  No obvious rash, lesion or ulcer.   LABORATORY AND RADIOLOGICAL DATA: Normal CBC, normal first set of cardiac enzymes, normal BMP, negative D-dimer.   Chest x-ray in the ED showed no acute cardiopulmonary disease.  EKG shows sinus arrhythmia with a controlled ventricular rate of 55 beats per minute.  No major ST-T changes.   IMPRESSION AND PLAN: 1. Intermittent chest pressure with shortness of breath likely due to underlying cardiac arrhythmias.  We will obtain serial troponins, monitor him on telemetry for any irregular rhythm, consult cardiology, Dr. Gwen PoundsKowalski, who knows him well. Obtain 2-D echocardiogram.  2. Sinus arrhythmias:  There was initial concern for possible new onset atrial fibrillation, although EKG does not confirm it.  I think he does have some P waves but does have irregular rhythm at times.  This is likely sinus arrhythmias. We will repeat EKG in the morning and obtain cardiac consult.  3. Hypertension: He will continue lisinopril. His blood pressure is well-controlled.  4. Hyperlipidemia. We will continue statin and check fasting lipid profile.   CODE STATUS: FULL CODE.  TOTAL TIME TAKING CARE OF THIS PATIENT: 55 minutes.   ____________________________ Ellamae SiaVipul S. Sherryll BurgerShah, MD vss:cb D: 09/27/2012 23:02:10 ET T: 09/27/2012 23:27:59 ET JOB#: [id number]  cc: Marwin Primmer S. Sherryll BurgerShah, MD, <Dictator> Lamar BlinksBruce J. Kowalski, MD Barbette ReichmannVishwanath Hande, MD  Ellamae SiaVIPUL S [facility] MD ELECTRONICALLY SIGNED 09/29/2012 23:26

## 2014-10-04 NOTE — H&P (Signed)
PATIENT NAME:  Nathaniel Fisher, Nathaniel Fisher MR#:  [id number] DATE OF BIRTH:  1925/11/13  DATE OF ADMISSION:  09/27/2012  Addendum to previously dictated history and physical.  Please add one more in the impression and plan:  Tobacco abuse.  The patient has been chewing tobacco.  He was counseled for about three minutes.  He does not feel he is going to be quitting this any time and he has been doing this for a long time.  Denies any need for nicotine patch while in the hospital at this time.    ____________________________ Ellamae SiaVipul S. Sherryll BurgerShah, MD vss:ea D: 09/27/2012 23:05:48 ET T: 09/27/2012 23:22:28 ET JOB#: [id number]  cc: Esbeidy Mclaine S. Sherryll BurgerShah, MD, <Dictator> Ellamae SiaVIPUL S [facility] MD ELECTRONICALLY SIGNED 09/29/2012 23:25

## 2014-10-04 NOTE — Discharge Summary (Signed)
PATIENT NAME:  Nathaniel Fisher, Nathaniel Fisher MR#:  [id number] DATE OF BIRTH:  11-22-1925  DATE OF ADMISSION:  12/28/2012 DATE OF DISCHARGE:  12/29/2012  DISCHARGE DIAGNOSES:  1.  Chest pain, myocardial infarction ruled out. Myoview scan essentially did not show evidence of significant ischemia.  2.  Anxiety.  3.  Hyperlipidemia.  4.  Hypertension.   CHIEF COMPLAINT: Chest pressure.   HISTORY OF PRESENT ILLNESS: Nathaniel Fisher is an 81 year old male with a history of hypertension, hyperlipidemia, history of previous CVA, was admitted with chest pressure. The patient states that his symptoms have been present on and off for the last 3 to 4 days, sometimes coming on at rest and sometimes with activity. He also states that the sensation was running up and down his abdomen as well. It did not radiate to jaw or neck. He had occasional dizziness and some shortness of breath associated with it. The patient was noted to have an elevated blood pressure of 188/78 with a heart rate between 40s and 50s.   PAST MEDICAL HISTORY: Significant for seizure disorder, history of previous CVA, hypertension, hyperlipidemia, history of sinus arrhythmia and bradycardia.   PHYSICAL EXAMINATION: VITAL SIGNS: Temp was 97.8, pulse rate is 44, respirations 18, blood pressure 188/78, O2 sat 98% on room air.  GENERAL: He was not in obvious distress.  HEENT: NCAT.  NECK: Supple.  HEART: S1, S2.  LUNGS: Clear to auscultation.  ABDOMEN: Soft, nontender.  EXTREMITIES: No edema.  NEUROLOGIC: Nonfocal.   LABORATORY AND DIAGNOSTIC DATA:  EKG showed evidence of sinus bradycardia. Q waves were noted in V1 and V2. No acute ST segment elevation was noted. Nonspecific intraventricular conduction delay was noted.  Labs: Glucose 105, BUN 13, creatinine 1.09, sodium 140, potassium 4.1, chloride 106. Troponin was negative.   HOSPITAL COURSE During his stay in the hospital, the patient was seen by a cardiologist, Dr. Gwen PoundsKowalski. He also  underwent a nuclear medicine Myoview scan which did not show any significant wall motion abnormality. It was considered to be a low risk scan, and pharmacological myocardial perfusion studies did not show evidence of significant ischemia. Estimated ejection fraction was 69%. Left ventricular global function was normal. There were no EKG changes concerning for ischemia. There was no artifact noted. The patient also has been having episodes of anxiety, and this was confirmed on talking to his family members. It was felt that he would benefit from anti-anxiety medication and was started on Zoloft 50 mg a day. His vitals remained stable and the patient was chest pain free at the time of discharge.   DISCHARGE MEDICATIONS:  Zoloft 25 mg once a day, amlodipine 5 mg once a day, aspirin 325 mg once a day, lovastatin 20 mg once a day, lisinopril 20 mg in the morning and 1/2 tablet evening, vitamin B12 500 mcg once a day, multivitamin 1 tab once a day.   DISCHARGE INSTRUCTIONS: The patient was advised a low-sodium diet and to follow with me, Dr. Marcello FennelHande, in 1 to 2 weeks' time.    Total time spent in discharge of this patient: 35 minutes  ____________________________ Barbette ReichmannVishwanath Lavoris Canizales, MD vh:cb D: 01/04/2013 18:29:27 ET T: 01/04/2013 21:36:49 ET JOB#: [id number]  cc: Barbette ReichmannVishwanath Ashlan Dignan, MD, <Dictator> Barbette ReichmannVISHWANATH Kiora Hallberg MD ELECTRONICALLY SIGNED 01/15/2013 17:28

## 2014-10-04 NOTE — Consult Note (Signed)
PATIENT NAME:  Nathaniel Fisher, Nathaniel Fisher MR#:  [id number] DATE OF BIRTH:  Jul 16, 1925  DATE OF CONSULTATION:  12/29/2012  REFERRING PHYSICIAN:  Starleen Arms, MD CONSULTING PHYSICIAN:  Lamar Blinks, MD  REASON FOR CONSULTATION: Unstable angina, history of atrial fibrillation, stroke, coronary artery disease, hypertension, hyperlipidemia and bradycardia.   CHIEF COMPLAINT: "My blood pressure went high."  HISTORY OF PRESENT ILLNESS: This is an 81 year old male with known coronary artery disease status post coronary artery bypass graft in the past, with hypertension, hyperlipidemia and peripheral vascular disease with a previous stroke. These have been stable in the recent past and has had a normal treadmill EKG in May 2014, where he had no evidence of chest discomfort. He then had significantly high blood pressure for which he was seen in the Emergency Room, and at that time, had some chest discomfort. Troponins were normal. His EKG shows normal sinus rhythm with left axis deviation and poor precordial progression. Since then, the patient has not had any further chest discomfort, although has had some weakness. Blood pressure is under better control at this time.   REVIEW OF SYSTEMS: The remainder of review of systems negative for vision change, ringing in the ears, hearing loss, cough, congestion, heartburn, nausea, vomiting, diarrhea, bloody stools, stomach pain, extremity pain, leg weakness, cramping of the buttocks, known blood clots, headaches, blackouts, dizzy spells, nosebleeds, congestion, trouble swallowing, frequent urination, urination at night, muscle weakness, numbness, anxiety, depression, skin lesions or skin rashes.   PAST MEDICAL HISTORY:  1. Coronary artery disease.  2. Hypertension.  3. Hyperlipidemia.  4. Atrial fibrillation.  5. Remote stroke.   FAMILY HISTORY: No family members with early onset of cardiovascular disease or hypertension.   SOCIAL HISTORY: Currently denies  alcohol or tobacco use.   ALLERGIES: AS LISTED.   MEDICATIONS: As listed.   PHYSICAL EXAMINATION:  VITAL SIGNS: Blood pressure is 144/66 bilaterally, heart rate is 58 upright, 62 reclining and regular.  GENERAL: He is a well-appearing male in no acute distress.  HEAD, EYES, EARS, NOSE AND THROAT: No icterus, thyromegaly, ulcers, hemorrhage or xanthelasma.  CARDIOVASCULAR: Regular rate and rhythm. Normal S1 and S2 with a 2/6 apical murmur consistent with mitral regurgitation. PMI is diffuse. Carotid upstroke normal without bruit. Jugular venous pressure is normal.  LUNGS: Have a few basilar crackles with normal respirations.  ABDOMEN: Soft, nontender, without hepatosplenomegaly or masses. Abdominal aorta is normal size without bruit.  EXTREMITIES: Show 2+ bilateral pulses in dorsal, pedal, radial and femoral arteries, without lower extremity edema, cyanosis, clubbing or ulcers.  NEUROLOGIC: He is oriented to time, place and person, with normal mood and affect.   ASSESSMENT: An 81 year old male with history of atrial fibrillation, remote stroke, coronary artery disease, hypertension, bradycardia, with unstable angina most consistent with possible coronary artery disease progression, needing further evaluation and treatment.   RECOMMENDATIONS:  1. Lexiscan infusion Myoview to assess for myocardial ischemia, re-stenosis of coronary arteries and/or bypass graft.  2. Continue current medical regimen for hypertension control and heart rate control, with reduction in the possibility of recurrence of atrial fibrillation.  3. Aspirin for further risk reduction in stroke.  4. Consider echocardiogram for LV systolic dysfunction, murmur, valvular heart disease causing current symptoms.  5. Continue telemetry for concerns of bradycardia, which is a history in the past.   6. Further treatment options after above.   ____________________________ Lamar Blinks, MD bjk:OSi D: 12/29/2012 12:37:25  ET T: 12/29/2012 13:44:45 ET JOB#: [id number]  cc: Quentin Cornwall.  Gwen PoundsKowalski, MD, <Dictator> Lamar BlinksBRUCE J Edras Wilford MD ELECTRONICALLY SIGNED 12/30/2012 10:33

## 2014-10-13 NOTE — Op Note (Signed)
PATIENT NAME:  Nathaniel Fisher, Nathaniel Fisher MR#:  [id number] DATE OF BIRTH:  [birth year]  DATE OF PROCEDURE:  09/30/2014  PRIMARY CARE PHYSICIAN:  Dr. Marcello FennelHande.    PREPROCEDURE DIAGNOSIS:  Sick sinus syndrome.   PROCEDURE:  Dual-chamber pacemaker implantation.   POSTPROCEDURE DIAGNOSIS:  Atrial sensing with ventricular pacing.   INDICATION:  The patient is an 81 year old gentleman who has had a recent history of generalized weakness, fatigue, dizziness, and exertional dyspnea.  Workup has revealed bradycardia, with Holter monitor demonstrating predominant sinus bradycardia consistent with sick sinus syndrome.   DESCRIPTION OF PROCEDURE:  The risks, benefits, and alternatives of permanent pacemaker implantation were explained to the patient, and informed written consent was obtained.  He was brought to the operating room in a fasting state.  The left pectoral region was prepped and draped in the usual sterile manner.  Anesthesia was obtained with 1% Xylocaine locally.  A 6 cm incision was performed over the left pectoral region.  The pacemaker pocket was generated by electrocautery and blunt dissection.  Access was obtained to the left subclavian vein by fine-needle aspiration.  MRI-compatible leads were positioned to the right ventricular apical septum and right atrial appendage under fluoroscopic guidance.  After proper thresholds were obtained, the leads were sutured in place.  The pacemaker pocket was irrigated with gentamicin solution.  The leads were connected to an MRI-compatible, dual-chamber, rate-responsive, pacemaker generator (Medtronic [id number]) and positioned in the pocket.  The pocket was closed with 2-0 and 4-0 Vicryl, respectively.  Steri-Strips and pressure dressing were applied.    ____________________________ Marcina MillardAlexander Molley Houser, MD ap:kc D: 09/30/2014 13:24:27 ET T: 09/30/2014 14:18:16 ET JOB#: [id number]  cc: Marcina MillardAlexander Tenee Wish, MD, <Dictator> Marcina MillardALEXANDER Guillaume Weninger MD ELECTRONICALLY SIGNED  10/08/2014 17:19

## 2014-10-13 NOTE — Consult Note (Signed)
PATIENT NAME:  Nathaniel Fisher, Nathaniel Fisher MR#:  [id number] DATE OF BIRTH:  Nov 18, 1925  DATE OF CONSULTATION:  10/02/2014  REFERRING PHYSICIAN:   CONSULTING PHYSICIAN:  Ramonita LabAruna Gouru, M.D.   REASON FOR CONSULTATION: Syncope with elevated troponin, bradycardia with pacemaker placement.   CHIEF COMPLAINT: "Weak."   HISTORY OF PRESENT ILLNESS:  This is an 81 year old male with known symptomatic bradycardia who has had a pacemaker placement in the last 2 days for which no complications have occurred. The patient was discharged, but was not ambulating very well and has had some difficulty with sensorium due to his being 89.  He arrived home and had difficulty with concerns of significant weakness. He did pass out and completely resolved with no evidence of injury. The patient therefore afterward was not able to converse with other family members and did not know who they were.  This may be due secondary to medications or other issues. When seen in the Emergency Room, he had normal sinus rhythm with left anterior fascicular block and occasional ventricular pacing. The patient also had elevated troponin of 0.09 most consistent with demand ischemia.  He also has known coronary artery disease, but no evidence of anginal symptoms. Currently, the patient is ambulating fairly well and no further issues.   REVIEW OF SYSTEMS: The remainder of review cannot be assessed due to the patient's difficulty with conversation.   PAST MEDICAL HISTORY:  1.  Coronary artery disease.  2.  Atrial fibrillation.  3.  Symptomatic bradycardia status post pacemaker placement.  4.  Hyperlipidemia.   FAMILY HISTORY: Father had hypertension, but no cardiovascular disease or diabetes.   SOCIAL HISTORY: The patient currently denies alcohol or tobacco use.   ALLERGIES: As listed.   MEDICATIONS: As listed.   PHYSICAL EXAMINATION:  VITAL SIGNS: Blood pressure is 90/50, heart rate is 70.  GENERAL: He is a well-appearing elderly male in no  acute distress.  HEENT: No icterus, thyromegaly, ulcers, hemorrhage, or xanthelasma.  CARDIOVASCULAR: Regular rate and rhythm. Normal S1 and S2, 2/6 apical murmur consistent with mitral regurgitation. PMI is inferiorly displaced. Carotid upstroke normal without bruit. Jugular venous pressure not seen.  LUNGS: Have a few expiratory wheezes, no rales.  ABDOMEN: Soft, nontender without hepatosplenomegaly or masses. Abdominal aorta is normal size without bruit.  EXTREMITIES: Show 2+ radial, femoral, dorsal pedal pulses with trace lower extremity edema. No cyanosis, clubbing, or ulcers.  NEUROLOGIC: He is not very oriented at this time, but no cranial nerve deficits.   ASSESSMENT: An 81 year old male with known symptomatic bradycardia status post pacemaker placement with an episode of syncope and disorientation possibly consistent with current medical regimen and hospitalization rather than other primary cardiovascular disease with elevated troponin most consistent with demand ischemia with coronary artery disease and no evidence of angina and no evidence of rhythm disturbances needing further treatment options.   RECOMMENDATIONS:  1.  Carotid Dopplers to assess for possible cause of syncope.  2.  Continue serial ECG and enzymes to assess for possible myocardial infarction.  3.  Continue following pacemaker for appropriate use with telemetry.  4.  Begin ambulation and follow for any further significant symptoms of cardiovascular disease and/or recurrent syncope, hypotension requiring adjustments of medications.  5.  Further evaluation of possible concerns that the patient has had side effects of medication and disorientation mainly from hospitalization with possible discharge to home if that is the case.    ____________________________ Lamar BlinksBruce J. Kowalski, MD bjk:sp D: 10/02/2014 10:38:01 ET T: 10/02/2014 11:14:13 ET  JOB#: [id number]  cc: Lamar Blinks, MD, <Dictator> Lamar Blinks  MD ELECTRONICALLY SIGNED 10/10/2014 10:33

## 2014-10-13 NOTE — Consult Note (Signed)
Referring Physician:  Ethlyn Daniels   Primary Care Physician:  [facility], [address], [place],  [address], [place] [phone]  Reason for Consult: Admit Date: 01-Oct-2014  Chief Complaint: syncope and confusion  Reason for Consult: confusion   History of Present Illness: History of Present Illness:   seen at request of Dr. Jannifer Franklin secondary to syncope and confusion;  81 yo RHD M presents to [facility] secondary to syncope.  Pt was just released from the hospital that same day and did not walk.  Pt had pacemaker placed.  Pt remembers getting up and feeling weak and then there is a question if he passed out.  Pt is not the best historian of this event.  There was no noted shaking, tongue biting or incontinence.  Pt has a remote hx of one seizure 30 years ago but none since.  Family notes that he is more confused here but that he also had some confusion previously with other hospitalizations.  He walks around and does most to all of his own ADLs but does often misplace things.  Family feels like he has slurred speech but his teeth are not in right now either.  ROS:  General denies complaints   HEENT no complaints   Lungs no complaints   Cardiac no complaints   GI no complaints   GU no complaints   Musculoskeletal no complaints   Extremities no complaints   Skin no complaints   Neuro no complaints   Endocrine no complaints   Psych no complaints   Past Medical/Surgical Hx:  Atrial Fibrillation:   Anemia: Pernicious  Coronary Artery Disease:   Hyperlipidemia:   Seizures: patient is unaware of any seizures, son concurs with this information  CVA/Stroke:   Pacemaker:   Pacemaker:   Carpal Tunnel Release:   Cholecystectomy:   Hernia Repair:   Appendectomy:   Past Medical/ Surgical Hx:  Past Medical History personally reviewed by me as above   Past Surgical History personally reviewed by me as above   Home  Medications: Medication Instructions Last Modified Date/Time  cephalexin 250 mg oral capsule 1 cap(s) orally every 6 hours 19-Apr-16 23:38  lovastatin 20 mg oral tablet 1 tab(s) orally once a day (at bedtime) 19-Apr-16 23:38  sertraline 25 mg oral tablet 1 tab(s) orally once a day 19-Apr-16 23:38  Vitamin B-12 250 mcg oral tablet 1 tab(s) orally once a day 19-Apr-16 23:38  aspirin 325 mg oral tablet 1 tab(s) orally once a day (in the evening) 19-Apr-16 23:38  lisinopril 10 mg oral tablet 1 tab(s) orally once a day 19-Apr-16 23:38   Allergies:  No Known Allergies:   Allergies:  Allergies NKDA   Social/Family History: Employment Status: retired  Lives With: alone  Living Arrangements: house  Social History: no tob, no EtOh, no illicits  Family History: no seizures, no stroke   Vital Signs: **Vital Signs.:   20-Apr-16 12:00  Vital Signs Type Routine  Temperature Temperature (F) 98.3  Celsius 36.8  Temperature Source oral  Pulse Pulse 58  Respirations Respirations 20  Systolic BP Systolic BP 496  Diastolic BP (mmHg) Diastolic BP (mmHg) 66  Mean BP 92  Pulse Ox % Pulse Ox % 97  Pulse Ox Activity Level  At rest  Oxygen Delivery Room Air/ 21 %   Physical Exam: General: nl weight, NAD  HEENT: normocephalic, sclera nonicteric, oropharynx clear  Neck: supple, no JVD, no bruits  Chest: CTA B, no  wheezing  Cardiac: RRR, no murmurs, no edema, 2+ pulses  Extremities: no C/C/E, FROM in LE but limited in R UE due to rotator cuff and L UE   Neurologic Exam: Mental Status: alert but oriented x 2 not time, follows simple commands but poor recall, mild dysarthria may be due to missing dentures  Cranial Nerves: PERRLA, EOMI, nl VF, face symmetric, tongue midline, shoulder shrug equal  Motor Exam: 5/5 B normal, tone, no tremor  Deep Tendon Reflexes: 1+/4B, plantars downgoing B  Sensory Exam: temp and pin intact B  Coordination: F to N WNL, HTS not tested   Lab Results:   LabObservation:  20-Apr-16 09:22   OBSERVATION Reason for Test  Routine Chem:  20-Apr-16 02:05   Result Comment - TROPONIN  - PREVIOUSLY CALLED  - 10-02-14 @0059   - BY AJO...AJO  Result(s) reported on 02 Oct 2014 at 02:48AM.  Glucose, Serum  120 (65-99 NOTE: New Reference Range  08/20/14)  BUN 9 (6-20 NOTE: New Reference Range  08/20/14)  Creatinine (comp) 0.83 (0.61-1.24 NOTE: New Reference Range  08/20/14)  Sodium, Serum 139 (135-145 NOTE: New Reference Range  08/20/14)  Potassium, Serum  3.3 (3.5-5.1 NOTE: New Reference Range  08/20/14)  Chloride, Serum 108 (101-111 NOTE: New Reference Range  08/20/14)  CO2, Serum 27 (22-32 NOTE: New Reference Range  08/20/14)  Calcium (Total), Serum  8.3 (8.9-10.3 NOTE: New Reference Range  08/20/14)  Anion Gap  4  eGFR (African American) >60  eGFR (Non-African American) >60 (eGFR values <41m/min/1.73 m2 may be an indication of chronic kidney disease (CKD). Calculated eGFR is useful in patients with stable renal function. The eGFR calculation will not be reliable in acutely ill patients when serum creatinine is changing rapidly. It is not useful in patients on dialysis. The eGFR calculation may not be applicable to patients at the low and high extremes of body sizes, pregnant women, and vegetarians.)  Magnesium, Serum 1.8 (1.7-2.4 THERAPEUTIC RANGE: 4-7 mg/dL TOXIC: > 10 mg/dL  ----------------------- NOTE: New Reference Range  08/20/14)  Cardiac:  20-Apr-16 02:05   Troponin I  0.09 (0.00-0.03 0.03 ng/mL or less: NEGATIVE  Repeat testing in 3-6 hrs  if clinically indicated. >0.05 ng/mL: POTENTIAL  MYOCARDIAL INJURY. Repeat  testing in 3-6 hrs if  clinically indicated. NOTE: An increase or decrease  of 30% or more on serial  testing suggests a  clinically important change NOTE: New Reference Range  08/20/14)  Routine UA:  19-Apr-16 23:09   Color (UA) Yellow  Clarity (UA) Clear  Glucose (UA) Negative   Bilirubin (UA) Negative  Ketones (UA) Negative  Specific Gravity (UA) 1.009  Blood (UA) Negative  pH (UA) 7.0  Protein (UA) Negative  Nitrite (UA) Negative  Leukocyte Esterase (UA) Negative (Result(s) reported on 02 Oct 2014 at 12:11AM.)  RBC (UA) 0-5  WBC (UA) 0-5  Bacteria (UA) RARE  Epithelial Cells (UA) NONE SEEN  Mucous (UA) PRESENT (Result(s) reported on 02 Oct 2014 at 12:11AM.)  Routine Coag:  20-Apr-16 02:05   Activated PTT (APTT) 30.6 (A HCT value >55% may artifactually increase the APTT. In one study, the increase was an average of 19%. Reference: "Effect on Routine and Special Coagulation Testing Values of Citrate Anticoagulant Adjustment in Patients with High HCT Values." American Journal of Clinical Pathology 27939;030:092-330)  Prothrombin  15.2 (11.4-15.0 NOTE: New Reference Range  07/12/14)  INR 1.2 (INR reference interval applies to patients on anticoagulant therapy. A single INR therapeutic range for coumarins is not optimal  for all indications; however, the suggested range for most indications is 2.0 - 3.0. Exceptions to the INR Reference Range may include: Prosthetic heart valves, acute myocardial infarction, prevention of myocardial infarction, and combinations of aspirin and anticoagulant. The need for a higher or lower target INR must be assessed individually. Reference: The Pharmacology and Management of the Vitamin K  antagonists: the seventh ACCP Conference on Antithrombotic and Thrombolytic Therapy. WIOXB.3532 Sept:126 (3suppl): [id number]. A HCT value >55% may artifactually increase the PT.  In one study,  the increase was an average of 25%. Reference:  "Effect on Routine and Special Coagulation Testing Values of Citrate Anticoagulant Adjustment in Patients with High HCT Values." American Journal of Clinical Pathology 2006;126:400-405.)  Routine Hem:  20-Apr-16 10:52   WBC (CBC) 9.6  RBC (CBC) 4.79  Hemoglobin (CBC) 13.6  Hematocrit (CBC) 40.9   Platelet Count (CBC) 161  MCV 85  MCH 28.4  MCHC 33.3  RDW 14.5  Neutrophil % 72.5  Lymphocyte % 18.8  Monocyte % 7.7  Eosinophil % 0.3  Basophil % 0.7  Neutrophil #  6.9  Lymphocyte # 1.8  Monocyte # 0.7  Eosinophil # 0.0  Basophil # 0.1 (Result(s) reported on 02 Oct 2014 at 11:13AM.)   Radiology Results: CT:    19-Apr-16 21:36, CT Head Without Contrast  CT Head Without Contrast   REASON FOR EXAM:    syncope, altered mental status  COMMENTS:       PROCEDURE: CT  - CT HEAD WITHOUT CONTRAST  - Oct 01 2014  9:36PM     CLINICAL DATA:  Syncope.    EXAM:  CT HEAD WITHOUT CONTRAST    TECHNIQUE:  Contiguous axial images were obtained from the base of the skull  through the vertex without intravenous contrast.    COMPARISON:  06/19/2013  FINDINGS:  Skull and Sinuses:No fracture or definite scalp injury.    Orbits: Left cataract resection.  No traumatic findings.    Brain: No evidence of acute infarction, hemorrhage, hydrocephalus,  or mass lesion/mass effect. There is generalized brain atrophy,  typical for age. Mild for age small-vessel disease with chronic  ischemic gliosis noted around the lateral ventricles.     IMPRESSION:  1. No acute intracranial findings.  2. Senescent changes, stable from January 2015.    Electronically Signed    By: Monte Fantasia M.D.    On: 10/01/2014 21:40         Verified By: Gilford Silvius, M.D.,   Radiology Impression: Radiology Impression: CT of head personally reviewed by me and shows moderate white matter disease and atrophy   Impression/Recommendations: Recommendations:   prior notes reviewed by me  reviewed by me   Probable syncope-  given that this episode occured while standing up and pt has known cardiac issues and elevated troponin this admission;  however, pt has remote hx of seizures and has had a recent SDH so he is at risk for seizure even though hx sounds more like syncope. Mild cognitive deficit-  this  seems like it has been going on for a few years and can explain pt's confusion in new environments. Dysarthria-  most likely secondary to lack of dentures repeat CT of head EEG in am continue all home medications from Neurologic standpoint needs PT consult will follow tomorrow only   Electronic Signatures: Jamison Neighbor (MD)  (Signed 20-Apr-16 16:56)  Authored: REFERRING PHYSICIAN, Primary Care Physician, Consult, History of Present Illness, Review of Systems, PAST MEDICAL/SURGICAL HISTORY, HOME  MEDICATIONS, ALLERGIES, Social/Family History, NURSING VITAL SIGNS, Physical Exam-, LAB RESULTS, RADIOLOGY RESULTS, Recommendations   Last Updated: 20-Apr-16 16:56 by Jamison Neighbor (MD)

## 2014-10-13 NOTE — H&P (Signed)
PATIENT NAME:  Nathaniel Fisher, Nathaniel Fisher MR#:  [id number] DATE OF BIRTH:  24-May-1926  DATE OF ADMISSION:  10/01/2014  CHIEF COMPLAINT:  Syncope.   HISTORY OF PRESENT ILLNESS:  This is an 81 year old gentleman who was sent home earlier on the day of admission after having pacemaker placement by Dr. Darrold Junker. He had pacer inserted for sick sinus syndrome. Per his family, who is participating in the exam today, his son and his daughter, who are here with him in the ED, he had been having significant shortness of breath for some time, several months, and had some bradycardia and so he had a pacemaker inserted for this. The patient also has a history of atrial fibrillation. He went home after pacemaker insertion and seemed to be doing okay some per his son; however, later that afternoon, he had an episode where he stood up and then had syncope. He was assisted to the floor by his son, who was there with him. After he came to from his syncope, he did not have a postictal state per se. He had no loss of bowel or bladder. He knew where he was generally and could speak without being too confused, although his family did start to notice this waxing and waning mental status change in him. They say that he would recognize them sometimes and then later on, 10 or 15 minutes later, will not recognize one of them or won't remember where he is. They stated that in the ED, he was acting very unlike himself, as normally when he comes to the hospital is very upset and does not want to stay, and currently he is being very cooperative. They state that the patient at baseline is very active at home and very mentally sharp. He is able to always know who everyone is and has a good memory and is functional and doing activities at his house like mowing his own lawn and still driving some in his truck and that he simply is not like that currently. Per his son, it took him 45 minutes to get from his truck into the house because he had to keep  stopping. He was somewhat confused and he has been making some statements that do not make a lot of sense, most specifically since the syncopal episode earlier today, so hospitalists were called for admission for workup for syncope and intermittent encephalopathy.   PRIMARY CARE PHYSICIAN:  Barbette Reichmann, MD, with [facility].   PAST MEDICAL HISTORY:  Atrial fibrillation, CAD, hyperlipidemia. He had a history of one seizure in the past when he had a mini stroke per his children. Sick sinus syndrome.   MEDICATIONS:  Vitamin B12, sertraline 25 mg daily, lovastatin 20 mg daily, lisinopril 20 mg daily, Keflex 250 mg q. 6 hours, aspirin 325 mg daily.   PAST SURGICAL HISTORY:  Cholecystectomy, appendectomy, carpal tunnel release, hernia repair, and now pacemaker insertion.   ALLERGIES:  No known drug allergies.   FAMILY HISTORY:  CAD, CVA, cancer.   SOCIAL HISTORY:  Nonsmoker, nondrinker. Denies illicit drug use. He does use chewing tobacco.   REVIEW OF SYSTEMS:  Difficult to obtain from the patient given his current status. He denies anything on review of systems; however, with his current mental status, this is possibly not reliable. Review of systems is also per his children below.   CONSTITUTIONAL:  Denies recent fever. Has had some fatigue and weakness.  EYES:  Denies blurred or double vision, pain, or redness.  EARS, NOSE, AND THROAT:  Denies ear pain. Has chronic hearing loss. Denies difficulty swallowing.  RESPIRATORY:  Denies cough. Has had significant dyspnea as per HPI. Denies painful respiration.  CARDIOVASCULAR:  Denies chest pain or edema. Denies palpitations. He did have a syncopal episode.  GASTROINTESTINAL:  Denies nausea, vomiting, diarrhea, abdominal pain, or constipation.  GENITOURINARY:  Denies dysuria, hematuria, or frequency.  ENDOCRINE:  Denies nocturia, thyroid problems, or heat or cold intolerance.  HEMATOLOGIC AND LYMPHATIC:  Denies easy bruising or bleeding or  swollen glands.  INTEGUMENTARY:  Denies acne, rash, or lesion.  MUSCULOSKELETAL:  Denies acute arthritis, joint swelling, or gout.  NEUROLOGICAL:  Denies numbness, weakness, or headache.  PSYCHIATRIC:  Denies anxiety, insomnia, or depression.   PHYSICAL EXAMINATION: VITAL SIGNS:  Blood pressure 138/68, pulse 73, temperature 98.5, respirations 20 with 100% O2 saturations on 2 liters of nasal cannula supplemental oxygen.  GENERAL:  This is an elderly gentleman lying supine in bed resting in no acute distress.  HEENT:  Pupils are equal, round, and reactive to light and accommodation. Extraocular movements are intact. No scleral icterus. Moist mucosal membranes.  NECK:  Thyroid is not enlarged. Neck is supple. No masses. Nontender. No cervical adenopathy. No JVD.  RESPIRATORY:  Clear to auscultation bilaterally. No rales, rhonchi, or wheezing. No respiratory distress.  CARDIOVASCULAR:  He has an irregular rate. On monitor, he has a paced rhythm with some PVCs. No murmur, rubs, or gallops auscultated. Good pedal pulses. No lower extremity edema.  ABDOMEN:  Soft, nontender, nondistended with good bowel sounds.  MUSCULOSKELETAL:  He has 5/5 muscular strength in all 4 extremities. Full spontaneous range of motion throughout. No cyanosis or clubbing.  SKIN:  No rash or lesions. No erythema. Skin is warm, dry, and intact.  LYMPHATIC:  No adenopathy.  NEUROLOGICAL:  Cranial nerves are intact. Sensation is intact throughout. No dysphasia or aphasia. His family states that they feel that his speech might be just a little more garbled than usual, but he has no overt dysarthria.  PSYCHIATRIC:  The patient is arousable. He is oriented to person. He is oriented to place. He is not well oriented to time or circumstance. Initially, he did know that he had a syncopal episode and then subsequently was not sure. He is cooperative. He has waxing and waning confusion. He is not agitated.   LABORATORY DATA:  White count  is 7.8, hemoglobin 13, hematocrit 39.8, and platelets are 214,000. Sodium is 143, potassium 3.7, chloride 105, bicarbonate 29, BUN 9, creatinine 0.74, glucose 104, and calcium is 9.5. Troponin is less than 0.03. Urinalysis is negative.   RADIOLOGIC DATA:  Chest x-ray:  No active disease. CT of the head:  No acute intracranial findings, senescent changes stable from 07/03/2013.   ASSESSMENT AND PLAN: 1.  Syncope. We will keep the patient on telemetry. He currently has a paced rhythm, which is stable. We will get a cardiology consult to weigh in on whether or not they feel like this is related to pacemaker implantation. Family is concerned about possible lingering anesthesia effects. We will get an echocardiogram. We will trend his cardiac enzymes.  2.  Encephalopathy. This is acute and sort of intermittent and waxing and waning in nature. It does not seem to be related to any kind of infection at this point, as his urinalysis and chest x-ray were negative and his white count was normal. Possibly related to pacemaker implantation, unclear, but given the timing and chronological correlation, potentially possibly a transient ischemic attack or small  stroke also possibly. We will consider neurology consult in the morning if he is not improving.  3.  Sick sinus syndrome status post pacemaker implantation. The pacer is pacing well. We will see what cardiology feels about this and keep him on telemetry for now.  4.  Atrial fibrillation. The patient is currently in a paced rhythm; however, his baseline atrial fibrillation will certainly put him at higher risk for something like a stroke potentially.  5.  Hyperlipidemia. We will continue the patient's statin for this chronic stable problem.  6.  Deep vein thrombosis prophylaxis. Heparin.   CODE STATUS:  Full code.   TIME SPENT ON THIS ADMISSION:  55 minutes.    ____________________________ Candace Cruise. Anne Hahn, MD dfw:nb D: 10/02/2014 01:26:19 ET T: 10/02/2014  02:16:35 ET JOB#: [id number]  cc: Candace Cruise. Anne Hahn, MD, <Dictator> Cyndel Griffey Scotty Court MD ELECTRONICALLY SIGNED 10/02/2014 3:11

## 2014-10-13 NOTE — Discharge Summary (Signed)
PATIENT NAME:  Nathaniel Fisher, Nathaniel Fisher MR#:  [id number] DATE OF BIRTH:  Aug 04, 1925  DATE OF ADMISSION:  10/01/2014 DATE OF DISCHARGE:  10/04/2014   DIAGNOSES AT TIME OF DISCHARGE: 1 Altered mental status with encephalopathy following syncopal event, 2 Recent pacemaker placement for sick sinus syndrome, 3 History of atrial fibrillation,  4 History of hyperlipidemia, 5 Gastroesophageal reflux disease. 6 Depression.   CHIEF COMPLAINT: Syncope.   HISTORY OF PRESENT ILLNESS: Nathaniel Fisher is an 81 year old gentleman who had a pacemaker for sick sinus syndrome with Dr. Darrold Junker. According to the family, after he went home, the patient had an episode when he stood up and subsequently had a syncopal event and had to be assisted to the floor by his son. The patient did not have any loss of bladder or bowel control. No history of seizures. The patient, however, did have some confusion associated with altered mental status and was unable to recognize some of his family members. According to family, he is also active and mentally alert, normally has a good memory; however, there had been a similar event a few years back, after which he gradually regained his baseline mental status.   PAST MEDICAL HISTORY: Significant for: Atrial fibrillation, CAD, hyperlipidemia, history of 1 seizure in the past, history of sick sinus syndrome. Please see H and P for full details.   HOSPITAL COURSE: The patient was admitted to [facility]. On physical examination, blood pressure was 138/68, pulse 76, temperature was 98.5, respirations 20. He was seen by neurologist in consultation. He also underwent a CT scan of the head x 2. X-ray of the shoulder was also done, which showed degenerative changes in the [facility] joint and glenohumeral joint, with a slightly high riding humeral head, possible rotator cuff injury. Laboratories on admission: Glucose 104, BUN 9, creatinine 0.74, sodium 143, potassium 3.7. CT of the head showed no acute  intracranial findings. There was senescent changes stable from January 2015. A repeat CT scan was ordered by neurologist Dr. Katrinka Blazing. It showed mild diffuse cortical atrophy. There was mild chronic ischemic white matter disease. No other acute intracranial abnormality was seen. The patient's troponin was less than 0.03 initially, but went up to 0.09. After that, his potassium was also low, and this was replaced. Other labs: Hemoglobin 13, hematocrit 39.8, WBC count 7.8, platelet count of 214,000. The patient was seen in consultation by cardiologist, Dr. Gwen Pounds, and also neurologist, Dr. Katrinka Blazing. He was also seen by physical therapy, and it was recommended that he go to skilled nursing facility. The patient remained stable. However, his mental status was not quite at baseline. He was able to answer a few questions and was advised to continue his home medications, including: Aspirin 325 mg once a day, lovastatin 20 mg once a day, sertraline 25 mg once a day, vitamin B12 1 tablet once a day, lisinopril 10 mg once a day, and Keflex 250 mg q. 6  for 3 more days.   DISCHARGE INSTRUCTIONS: The patient was advised home PT and to follow with me, Dr. Cornelius Moras in 1-2 weeks' time. Also, advised to follow up with cardiologist, Dr. Darrold Junker, in 1-2 weeks' time. The family has been advised to call back with any questions or concerns. We will also consider orthopedic referral for left shoulder pain to rule out possible rotator cuff injury.   Total time spent in discharge of this pt; 1 hour  ____________________________ Barbette Reichmann, MD vh:mw D: 10/04/2014 14:19:00 ET T: 10/04/2014 14:49:19 ET JOB#: [id number]  cc:  Barbette ReichmannVishwanath Ayanna Gheen, MD, <Dictator> Barbette ReichmannVISHWANATH Elita Dame MD ELECTRONICALLY SIGNED 10/09/2014 13:31

## 2016-04-18 ENCOUNTER — Inpatient Hospital Stay
Admission: EM | Admit: 2016-04-18 | Discharge: 2016-04-22 | DRG: 481 | Disposition: A | Discharge status: Skilled Nursing Facility | Payer: Medicare Other | Attending: Internal Medicine | Admitting: Internal Medicine

## 2016-04-18 ENCOUNTER — Encounter
Admission: EM | Disposition: A | Discharge status: Skilled Nursing Facility | Payer: Self-pay | Source: Home / Self Care | Attending: Internal Medicine

## 2016-04-18 ENCOUNTER — Inpatient Hospital Stay: Payer: Medicare Other | Admitting: Anesthesiology

## 2016-04-18 ENCOUNTER — Inpatient Hospital Stay: Payer: Medicare Other

## 2016-04-18 ENCOUNTER — Emergency Department: Payer: Medicare Other

## 2016-04-18 ENCOUNTER — Encounter: Payer: Self-pay | Admitting: Emergency Medicine

## 2016-04-18 DIAGNOSIS — F17290 Nicotine dependence, other tobacco product, uncomplicated: Secondary | ICD-10-CM | POA: Diagnosis present

## 2016-04-18 DIAGNOSIS — Z79899 Other long term (current) drug therapy: Secondary | ICD-10-CM

## 2016-04-18 DIAGNOSIS — Z950 Presence of cardiac pacemaker: Secondary | ICD-10-CM | POA: Diagnosis not present

## 2016-04-18 DIAGNOSIS — W1830XA Fall on same level, unspecified, initial encounter: Secondary | ICD-10-CM | POA: Diagnosis present

## 2016-04-18 DIAGNOSIS — F329 Major depressive disorder, single episode, unspecified: Secondary | ICD-10-CM | POA: Diagnosis present

## 2016-04-18 DIAGNOSIS — M6281 Muscle weakness (generalized): Secondary | ICD-10-CM

## 2016-04-18 DIAGNOSIS — Z8673 Personal history of transient ischemic attack (TIA), and cerebral infarction without residual deficits: Secondary | ICD-10-CM

## 2016-04-18 DIAGNOSIS — R509 Fever, unspecified: Secondary | ICD-10-CM

## 2016-04-18 DIAGNOSIS — Y92009 Unspecified place in unspecified non-institutional (private) residence as the place of occurrence of the external cause: Secondary | ICD-10-CM

## 2016-04-18 DIAGNOSIS — S72001A Fracture of unspecified part of neck of right femur, initial encounter for closed fracture: Principal | ICD-10-CM

## 2016-04-18 DIAGNOSIS — R262 Difficulty in walking, not elsewhere classified: Secondary | ICD-10-CM

## 2016-04-18 DIAGNOSIS — H9190 Unspecified hearing loss, unspecified ear: Secondary | ICD-10-CM | POA: Diagnosis present

## 2016-04-18 DIAGNOSIS — E876 Hypokalemia: Secondary | ICD-10-CM | POA: Diagnosis present

## 2016-04-18 DIAGNOSIS — I10 Essential (primary) hypertension: Secondary | ICD-10-CM | POA: Diagnosis present

## 2016-04-18 DIAGNOSIS — W19XXXA Unspecified fall, initial encounter: Secondary | ICD-10-CM

## 2016-04-18 DIAGNOSIS — S72009A Fracture of unspecified part of neck of unspecified femur, initial encounter for closed fracture: Secondary | ICD-10-CM

## 2016-04-18 DIAGNOSIS — R778 Other specified abnormalities of plasma proteins: Secondary | ICD-10-CM | POA: Diagnosis present

## 2016-04-18 DIAGNOSIS — R7989 Other specified abnormal findings of blood chemistry: Secondary | ICD-10-CM | POA: Diagnosis present

## 2016-04-18 DIAGNOSIS — I248 Other forms of acute ischemic heart disease: Secondary | ICD-10-CM | POA: Diagnosis present

## 2016-04-18 HISTORY — PX: INTRAMEDULLARY (IM) NAIL INTERTROCHANTERIC: SHX5875

## 2016-04-18 LAB — CBC WITH DIFFERENTIAL/PLATELET
BASOS ABS: 0.1 10*3/uL (ref 0–0.1)
BASOS PCT: 1 %
Basophils Absolute: 0 10*3/uL (ref 0–0.1)
Basophils Relative: 0 %
EOS ABS: 0.1 10*3/uL (ref 0–0.7)
EOS ABS: 0 10*3/uL (ref 0–0.7)
EOS PCT: 1 %
EOS PCT: 0 %
HCT: 42 % (ref 40.0–52.0)
HCT: 37.7 % — ABNORMAL LOW (ref 40.0–52.0)
HEMOGLOBIN: 13 g/dL (ref 13.0–18.0)
Hemoglobin: 14.3 g/dL (ref 13.0–18.0)
LYMPHS ABS: 1.1 10*3/uL (ref 1.0–3.6)
LYMPHS PCT: 10 %
Lymphocytes Relative: 9 %
Lymphs Abs: 1.4 10*3/uL (ref 1.0–3.6)
MCH: 30.7 pg (ref 26.0–34.0)
MCH: 30.8 pg (ref 26.0–34.0)
MCHC: 34.1 g/dL (ref 32.0–36.0)
MCHC: 34.5 g/dL (ref 32.0–36.0)
MCV: 90 fL (ref 80.0–100.0)
MCV: 89.4 fL (ref 80.0–100.0)
MONO ABS: 0.6 10*3/uL (ref 0.2–1.0)
MONOS PCT: 8 %
Monocytes Absolute: 0.9 10*3/uL (ref 0.2–1.0)
Monocytes Relative: 5 %
Neutro Abs: 11 10*3/uL — ABNORMAL HIGH (ref 1.4–6.5)
Neutro Abs: 9.3 10*3/uL — ABNORMAL HIGH (ref 1.4–6.5)
Neutrophils Relative %: 83 %
Neutrophils Relative %: 83 %
PLATELETS: 222 10*3/uL (ref 150–440)
PLATELETS: 203 10*3/uL (ref 150–440)
RBC: 4.67 MIL/uL (ref 4.40–5.90)
RBC: 4.22 MIL/uL — ABNORMAL LOW (ref 4.40–5.90)
RDW: 14.1 % (ref 11.5–14.5)
RDW: 13.9 % (ref 11.5–14.5)
WBC: 13.1 10*3/uL — ABNORMAL HIGH (ref 3.8–10.6)
WBC: 11.4 10*3/uL — ABNORMAL HIGH (ref 3.8–10.6)

## 2016-04-18 LAB — BASIC METABOLIC PANEL
Anion gap: 9 (ref 5–15)
BUN: 6 mg/dL (ref 6–20)
CALCIUM: 9 mg/dL (ref 8.9–10.3)
CO2: 30 mmol/L (ref 22–32)
CREATININE: 1.04 mg/dL (ref 0.61–1.24)
Chloride: 100 mmol/L — ABNORMAL LOW (ref 101–111)
GFR calc non Af Amer: >60 mL/min (ref >60)
Glucose, Bld: 135 mg/dL — ABNORMAL HIGH (ref 65–99)
Potassium: 3.2 mmol/L — ABNORMAL LOW (ref 3.5–5.1)
Sodium: 139 mmol/L (ref 135–145)

## 2016-04-18 LAB — COMPREHENSIVE METABOLIC PANEL
ALK PHOS: 52 U/L (ref 38–126)
ALT: 15 U/L — ABNORMAL LOW (ref 17–63)
ANION GAP: 6 (ref 5–15)
AST: 38 U/L (ref 15–41)
Albumin: 3.6 g/dL (ref 3.5–5.0)
BUN: 6 mg/dL (ref 6–20)
CALCIUM: 8.3 mg/dL — AB (ref 8.9–10.3)
CO2: 30 mmol/L (ref 22–32)
Chloride: 102 mmol/L (ref 101–111)
Creatinine, Ser: 0.86 mg/dL (ref 0.61–1.24)
Glucose, Bld: 128 mg/dL — ABNORMAL HIGH (ref 65–99)
Potassium: 3 mmol/L — ABNORMAL LOW (ref 3.5–5.1)
SODIUM: 138 mmol/L (ref 135–145)
TOTAL PROTEIN: 6.1 g/dL — AB (ref 6.5–8.1)
Total Bilirubin: 0.4 mg/dL (ref 0.3–1.2)

## 2016-04-18 LAB — TYPE AND SCREEN
ABO/RH(D): A POS
Antibody Screen: NEGATIVE

## 2016-04-18 LAB — URINALYSIS COMPLETE WITH MICROSCOPIC (ARMC ONLY)
BACTERIA UA: NONE SEEN
BILIRUBIN URINE: NEGATIVE
GLUCOSE, UA: NEGATIVE mg/dL
HGB URINE DIPSTICK: NEGATIVE
Ketones, ur: NEGATIVE mg/dL
Leukocytes, UA: NEGATIVE
NITRITE: NEGATIVE
Protein, ur: NEGATIVE mg/dL
SQUAMOUS EPITHELIAL / LPF: NONE SEEN
Specific Gravity, Urine: 1.004 — ABNORMAL LOW (ref 1.005–1.030)
WBC, UA: NONE SEEN WBC/hpf (ref 0–5)
pH: 7 (ref 5.0–8.0)

## 2016-04-18 LAB — TSH: TSH: 1.806 u[IU]/mL (ref 0.350–4.500)

## 2016-04-18 LAB — PROTIME-INR
INR: 1.04
Prothrombin Time: 13.6 seconds (ref 11.4–15.2)

## 2016-04-18 LAB — TROPONIN I
TROPONIN I: 0.1 ng/mL — AB (ref <0.03)
Troponin I: 0.08 ng/mL (ref <0.03)
Troponin I: 0.1 ng/mL (ref <0.03)

## 2016-04-18 LAB — MRSA PCR SCREENING: MRSA by PCR: NEGATIVE

## 2016-04-18 LAB — APTT: aPTT: 26 seconds (ref 24–36)

## 2016-04-18 LAB — MAGNESIUM: MAGNESIUM: 2.1 mg/dL (ref 1.7–2.4)

## 2016-04-18 SURGERY — FIXATION, FRACTURE, INTERTROCHANTERIC, WITH INTRAMEDULLARY ROD
Anesthesia: Spinal | Laterality: Right

## 2016-04-18 MED ORDER — MORPHINE SULFATE (PF) 2 MG/ML IV SOLN
2.0000 mg | INTRAVENOUS | Status: DC | PRN
Start: 2016-04-18 — End: 2016-04-18

## 2016-04-18 MED ORDER — CEFAZOLIN SODIUM-DEXTROSE 2-4 GM/100ML-% IV SOLN
2.0000 g | INTRAVENOUS | Status: AC
  Administered 2016-04-18: 2 g via INTRAVENOUS
  Filled 2016-04-18: qty 100

## 2016-04-18 MED ORDER — ENOXAPARIN SODIUM 40 MG/0.4ML ~~LOC~~ SOLN
40.0000 mg | SUBCUTANEOUS | Status: DC
  Administered 2016-04-19 – 2016-04-22 (×4): 40 mg via SUBCUTANEOUS
  Filled 2016-04-18 (×4): qty 0.4

## 2016-04-18 MED ORDER — POLYETHYLENE GLYCOL 3350 17 G PO PACK
17.0000 g | PACK | Freq: Every day | ORAL | Status: DC | PRN
  Administered 2016-04-20: 17 g via ORAL
  Filled 2016-04-18: qty 1

## 2016-04-18 MED ORDER — MIDAZOLAM HCL 5 MG/5ML IJ SOLN
INTRAMUSCULAR | Status: DC | PRN
  Administered 2016-04-18: 0.5 mg via INTRAVENOUS
  Administered 2016-04-18: 1 mg via INTRAVENOUS
  Administered 2016-04-18: 0.5 mg via INTRAVENOUS

## 2016-04-18 MED ORDER — ACETAMINOPHEN 650 MG RE SUPP: 650.0000 mg | Freq: Four times a day (QID) | RECTAL | Status: DC | PRN

## 2016-04-18 MED ORDER — ONDANSETRON HCL 4 MG PO TABS: 4.0000 mg | ORAL_TABLET | Freq: Four times a day (QID) | ORAL | Status: DC | PRN

## 2016-04-18 MED ORDER — ONDANSETRON HCL 4 MG/2ML IJ SOLN: 4.0000 mg | Freq: Four times a day (QID) | INTRAMUSCULAR | Status: DC | PRN

## 2016-04-18 MED ORDER — METHOCARBAMOL 1000 MG/10ML IJ SOLN
500.0000 mg | Freq: Four times a day (QID) | INTRAVENOUS | Status: DC | PRN
  Filled 2016-04-18: qty 5

## 2016-04-18 MED ORDER — POTASSIUM CHLORIDE CRYS ER 20 MEQ PO TBCR
40.0000 meq | EXTENDED_RELEASE_TABLET | Freq: Once | ORAL | Status: AC
  Administered 2016-04-18: 40 meq via ORAL
  Filled 2016-04-18: qty 2

## 2016-04-18 MED ORDER — CEFAZOLIN SODIUM-DEXTROSE 2-4 GM/100ML-% IV SOLN
2.0000 g | Freq: Four times a day (QID) | INTRAVENOUS | Status: AC
  Administered 2016-04-18 – 2016-04-19 (×2): 2 g via INTRAVENOUS
  Filled 2016-04-18 (×2): qty 100

## 2016-04-18 MED ORDER — BISACODYL 10 MG RE SUPP
10.0000 mg | Freq: Every day | RECTAL | Status: DC | PRN
  Administered 2016-04-21: 10 mg via RECTAL
  Filled 2016-04-18: qty 1

## 2016-04-18 MED ORDER — BUPIVACAINE HCL (PF) 0.5 % IJ SOLN
INTRAMUSCULAR | Status: DC | PRN
  Administered 2016-04-18: 12 mL

## 2016-04-18 MED ORDER — SENNA 8.6 MG PO TABS
1.0000 | ORAL_TABLET | Freq: Two times a day (BID) | ORAL | Status: DC
  Administered 2016-04-19 – 2016-04-22 (×7): 8.6 mg via ORAL
  Filled 2016-04-18 (×7): qty 1

## 2016-04-18 MED ORDER — ACETAMINOPHEN 325 MG PO TABS
650.0000 mg | ORAL_TABLET | Freq: Four times a day (QID) | ORAL | Status: DC | PRN
  Administered 2016-04-21: 650 mg via ORAL
  Filled 2016-04-18: qty 2

## 2016-04-18 MED ORDER — PRAVASTATIN SODIUM 20 MG PO TABS: 20.0000 mg | ORAL_TABLET | Freq: Every day | ORAL | Status: DC

## 2016-04-18 MED ORDER — FERROUS SULFATE 325 (65 FE) MG PO TABS
325.0000 mg | ORAL_TABLET | Freq: Every day | ORAL | Status: DC
  Administered 2016-04-18: 325 mg via ORAL
  Filled 2016-04-18: qty 1

## 2016-04-18 MED ORDER — SODIUM CHLORIDE 0.9 % IV SOLN
75.0000 mL/h | INTRAVENOUS | Status: DC
Start: 2016-04-18 — End: 2016-04-19
  Administered 2016-04-18: 75 mL/h via INTRAVENOUS

## 2016-04-18 MED ORDER — OXYCODONE HCL 5 MG PO TABS
5.0000 mg | ORAL_TABLET | ORAL | Status: DC | PRN
  Administered 2016-04-19: 10 mg via ORAL
  Administered 2016-04-19 – 2016-04-20 (×2): 5 mg via ORAL
  Administered 2016-04-20 (×2): 10 mg via ORAL
  Filled 2016-04-18: qty 2
  Filled 2016-04-18: qty 1
  Filled 2016-04-18: qty 2
  Filled 2016-04-18: qty 1
  Filled 2016-04-18: qty 2

## 2016-04-18 MED ORDER — OXYCODONE HCL 5 MG PO TABS
5.0000 mg | ORAL_TABLET | ORAL | Status: DC | PRN
  Administered 2016-04-18: 5 mg via ORAL
  Filled 2016-04-18: qty 1

## 2016-04-18 MED ORDER — ACETAMINOPHEN 325 MG PO TABS: 650.0000 mg | ORAL_TABLET | Freq: Four times a day (QID) | ORAL | Status: DC | PRN

## 2016-04-18 MED ORDER — ONDANSETRON HCL 4 MG/2ML IJ SOLN: 4.0000 mg | Freq: Once | INTRAMUSCULAR | Status: DC | PRN

## 2016-04-18 MED ORDER — METHOCARBAMOL 1000 MG/10ML IJ SOLN: 500.0000 mg | Freq: Four times a day (QID) | INTRAVENOUS | Status: DC | PRN

## 2016-04-18 MED ORDER — METHOCARBAMOL 500 MG PO TABS
500.0000 mg | ORAL_TABLET | Freq: Four times a day (QID) | ORAL | Status: DC | PRN
  Administered 2016-04-20: 500 mg via ORAL
  Filled 2016-04-18: qty 1

## 2016-04-18 MED ORDER — EPHEDRINE SULFATE 50 MG/ML IJ SOLN
INTRAMUSCULAR | Status: DC | PRN
  Administered 2016-04-18 (×4): 5 mg via INTRAVENOUS

## 2016-04-18 MED ORDER — MORPHINE SULFATE (PF) 2 MG/ML IV SOLN: 2.0000 mg | INTRAVENOUS | Status: DC | PRN

## 2016-04-18 MED ORDER — SERTRALINE HCL 50 MG PO TABS
25.0000 mg | ORAL_TABLET | Freq: Every day | ORAL | Status: DC
  Administered 2016-04-18: 25 mg via ORAL
  Filled 2016-04-18: qty 1

## 2016-04-18 MED ORDER — MORPHINE SULFATE (PF) 2 MG/ML IV SOLN
INTRAVENOUS | Status: AC
  Administered 2016-04-18: 4 mg via INTRAVENOUS
  Filled 2016-04-18: qty 2

## 2016-04-18 MED ORDER — ALUM & MAG HYDROXIDE-SIMETH 200-200-20 MG/5ML PO SUSP: 30.0000 mL | ORAL | Status: DC | PRN

## 2016-04-18 MED ORDER — NEOMYCIN-POLYMYXIN B GU 40-200000 IR SOLN
Status: DC | PRN
  Administered 2016-04-18: 4 mL

## 2016-04-18 MED ORDER — FERROUS SULFATE 325 (65 FE) MG PO TABS
325.0000 mg | ORAL_TABLET | Freq: Three times a day (TID) | ORAL | Status: DC
  Administered 2016-04-19 – 2016-04-22 (×7): 325 mg via ORAL
  Filled 2016-04-18 (×7): qty 1

## 2016-04-18 MED ORDER — NEOMYCIN-POLYMYXIN B GU 40-200000 IR SOLN
Status: AC
  Filled 2016-04-18: qty 4

## 2016-04-18 MED ORDER — MAGNESIUM CITRATE PO SOLN
1.0000 | Freq: Once | ORAL | Status: DC | PRN
  Filled 2016-04-18: qty 296

## 2016-04-18 MED ORDER — PHENOL 1.4 % MT LIQD
1.0000 | OROMUCOSAL | Status: DC | PRN
  Filled 2016-04-18: qty 177

## 2016-04-18 MED ORDER — ACETAMINOPHEN 500 MG PO TABS
1000.0000 mg | ORAL_TABLET | Freq: Four times a day (QID) | ORAL | Status: AC
  Administered 2016-04-19: 1000 mg via ORAL
  Filled 2016-04-18: qty 2

## 2016-04-18 MED ORDER — FENTANYL CITRATE (PF) 100 MCG/2ML IJ SOLN: 25.0000 ug | INTRAMUSCULAR | Status: DC | PRN

## 2016-04-18 MED ORDER — MENTHOL 3 MG MT LOZG
1.0000 | LOZENGE | OROMUCOSAL | Status: DC | PRN
  Filled 2016-04-18: qty 9

## 2016-04-18 MED ORDER — TETRACAINE HCL 1 % IJ SOLN
INTRAMUSCULAR | Status: DC | PRN
Start: 2016-04-18 — End: 2016-04-18

## 2016-04-18 MED ORDER — DOCUSATE SODIUM 100 MG PO CAPS
100.0000 mg | ORAL_CAPSULE | Freq: Two times a day (BID) | ORAL | Status: DC
  Administered 2016-04-18: 100 mg via ORAL
  Filled 2016-04-18: qty 1

## 2016-04-18 MED ORDER — LABETALOL HCL 5 MG/ML IV SOLN
5.0000 mg | INTRAVENOUS | Status: DC | PRN
  Filled 2016-04-18: qty 4

## 2016-04-18 MED ORDER — TETRACAINE HCL 1 % IJ SOLN
INTRAMUSCULAR | Status: DC | PRN
  Administered 2016-04-18: 4 mg via INTRASPINAL

## 2016-04-18 MED ORDER — DOCUSATE SODIUM 100 MG PO CAPS
100.0000 mg | ORAL_CAPSULE | Freq: Two times a day (BID) | ORAL | Status: DC
  Administered 2016-04-19 – 2016-04-21 (×5): 100 mg via ORAL
  Filled 2016-04-18 (×5): qty 1

## 2016-04-18 MED ORDER — FENTANYL CITRATE (PF) 100 MCG/2ML IJ SOLN
INTRAMUSCULAR | Status: DC | PRN
  Administered 2016-04-18 (×2): 25 ug via INTRAVENOUS
  Administered 2016-04-18: 50 ug via INTRAVENOUS
  Administered 2016-04-18: 25 ug via INTRAVENOUS

## 2016-04-18 MED ORDER — LISINOPRIL 10 MG PO TABS: 10.0000 mg | ORAL_TABLET | Freq: Every day | ORAL | Status: DC

## 2016-04-18 MED ORDER — KETAMINE HCL 10 MG/ML IJ SOLN
INTRAMUSCULAR | Status: DC | PRN
  Administered 2016-04-18 (×2): 50 mg via INTRAVENOUS

## 2016-04-18 MED ORDER — POTASSIUM CHLORIDE IN NACL 40-0.9 MEQ/L-% IV SOLN
INTRAVENOUS | Status: DC
  Administered 2016-04-18: 125 mL/h via INTRAVENOUS
  Filled 2016-04-18 (×3): qty 1000

## 2016-04-18 MED ORDER — ONDANSETRON HCL 4 MG/2ML IJ SOLN
INTRAMUSCULAR | Status: AC
  Administered 2016-04-18: 4 mg
  Filled 2016-04-18: qty 2

## 2016-04-18 MED ORDER — LACTATED RINGERS IV SOLN
INTRAVENOUS | Status: DC | PRN
  Administered 2016-04-18: 16:00:00 via INTRAVENOUS

## 2016-04-18 MED ORDER — PROPOFOL 500 MG/50ML IV EMUL
INTRAVENOUS | Status: DC | PRN
  Administered 2016-04-18: 50 ug/kg/min via INTRAVENOUS

## 2016-04-18 MED ORDER — SODIUM CHLORIDE 0.9% FLUSH: 3.0000 mL | Freq: Two times a day (BID) | INTRAVENOUS | Status: DC

## 2016-04-18 MED ORDER — METHOCARBAMOL 500 MG PO TABS
500.0000 mg | ORAL_TABLET | Freq: Four times a day (QID) | ORAL | Status: DC | PRN
  Filled 2016-04-18: qty 1

## 2016-04-18 MED ORDER — METOPROLOL TARTRATE 25 MG PO TABS
12.5000 mg | ORAL_TABLET | Freq: Two times a day (BID) | ORAL | Status: DC
  Administered 2016-04-18: 12.5 mg via ORAL
  Filled 2016-04-18: qty 1

## 2016-04-18 MED ORDER — CHLORHEXIDINE GLUCONATE CLOTH 2 % EX PADS
6.0000 | MEDICATED_PAD | Freq: Once | CUTANEOUS | Status: AC
  Administered 2016-04-18: 6 via TOPICAL

## 2016-04-18 SURGICAL SUPPLY — 37 items
BIT DRILL CANN LG 4.3MM (BIT) ×1 IMPLANT
BNDG COHESIVE 6X5 TAN STRL LF (GAUZE/BANDAGES/DRESSINGS) ×6 IMPLANT
CANISTER SUCT 1200ML W/VALVE (MISCELLANEOUS) ×3 IMPLANT
DRAPE C-ARMOR (DRAPES) ×3 IMPLANT
DRAPE SHEET LG 3/4 BI-LAMINATE (DRAPES) ×6 IMPLANT
DRAPE SURG 17X11 SM STRL (DRAPES) ×6 IMPLANT
DRAPE U-SHAPE 47X51 STRL (DRAPES) ×3 IMPLANT
DRILL BIT CANN LG 4.3MM (BIT) ×3
DRSG OPSITE POSTOP 4X14 (GAUZE/BANDAGES/DRESSINGS) ×3 IMPLANT
DURAPREP 26ML APPLICATOR (WOUND CARE) ×6 IMPLANT
ELECT REM PT RETURN 9FT ADLT (ELECTROSURGICAL) ×3
ELECTRODE REM PT RTRN 9FT ADLT (ELECTROSURGICAL) ×1 IMPLANT
GAUZE XEROFORM 4X4 STRL (GAUZE/BANDAGES/DRESSINGS) ×3 IMPLANT
GLOVE BIOGEL PI IND STRL 9 (GLOVE) ×1 IMPLANT
GLOVE BIOGEL PI INDICATOR 9 (GLOVE) ×2
GLOVE SURG 9.0 ORTHO LTXF (GLOVE) ×12 IMPLANT
GOWN STRL REUS TWL 2XL XL LVL4 (GOWN DISPOSABLE) ×3 IMPLANT
GOWN STRL REUS W/ TWL LRG LVL3 (GOWN DISPOSABLE) ×1 IMPLANT
GOWN STRL REUS W/TWL LRG LVL3 (GOWN DISPOSABLE) ×2
GUIDEPIN VERSANAIL DSP 3.2X444 ×6 IMPLANT
HEMOVAC 400CC 10FR (MISCELLANEOUS) IMPLANT
KIT RM TURNOVER CYSTO AR (KITS) ×3 IMPLANT
MAT BLUE FLOOR 46X72 FLO (MISCELLANEOUS) ×3 IMPLANT
NAIL HIP FRACT 130D 9X180 (Orthopedic Implant) ×3 IMPLANT
NS IRRIG 1000ML POUR BTL (IV SOLUTION) ×3 IMPLANT
PACK HIP COMPR (MISCELLANEOUS) ×3 IMPLANT
SCREW BONE CORTICAL 5.0X40 (Screw) ×3 IMPLANT
SCREW LAG 10.5MMX105MM HFN (Screw) ×3 IMPLANT
SCREWDRIVER HEX TIP 3.5MM (MISCELLANEOUS) ×3 IMPLANT
STAPLER SKIN PROX 35W (STAPLE) ×3 IMPLANT
SUCTION FRAZIER HANDLE 10FR (MISCELLANEOUS) ×2
SUCTION TUBE FRAZIER 10FR DISP (MISCELLANEOUS) ×1 IMPLANT
SUT VIC AB 0 CT1 36 (SUTURE) ×6 IMPLANT
SUT VIC AB 2-0 CT1 27 (SUTURE) ×2
SUT VIC AB 2-0 CT1 TAPERPNT 27 (SUTURE) ×1 IMPLANT
SUT VICRYL 0 AB UR-6 (SUTURE) ×3 IMPLANT
SYR 30ML LL (SYRINGE) ×3 IMPLANT

## 2016-04-18 NOTE — Anesthesia Procedure Notes (Signed)
Spinal
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
## 2016-04-18 NOTE — Op Note (Signed)
DATE OF SURGERY:  04/18/2016  TIME: 6:07 PM  PATIENT NAME:  Nathaniel Fisher  AGE: 82 y.o.  PRE-OPERATIVE DIAGNOSIS:  Right basicervical hip fracture  POST-OPERATIVE DIAGNOSIS:  SAME  PROCEDURE:  INTRAMEDULLARY (IM) NAIL BASICERVICAL RIGHT HIP FRACTURE  SURGEON:  Juanell FairlyKRASINSKI, Shamecca Whitebread  OPERATIVE IMPLANTS: Biomet short Affixus nail 9 x 180mm, 105 mm lag screw with a 40 mm distal interlocking screw  PREOPERATIVE INDICATIONS:  Nathaniel Fisher is a 82 y.o. year old who fell and suffered a hip fracture. He was brought into the ER and then admitted and medically cleared for surgical intervention by cardiology.    The risks, benefits and alternatives were discussed with the patient and their family.  The risks include but are not limited to infection, bleeding, nerve or blood vessel injury, malunion, nonunion, hardware prominence, hardware failure, change in leg lengths or lower extremity rotation need for further surgery including hardware removal with conversion to a total hip arthroplasty. Medical risks include but are not limited to DVT and pulmonary embolism, myocardial infarction, stroke, pneumonia, respiratory failure and death. The patient and their family understood these risks and wished to proceed with surgery.  OPERATIVE PROCEDURE:  The patient was brought to the operating room and placed in the supine position on the fracture table. Spinal anesthesia was administered.  A closed reduction was performed under C-arm guidance.  The fracture reduction was confirmed on both AP and lateral views. A time out was performed to verify the patient's name, date of birth, medical record number, correct site of surgery correct procedure to be performed. The timeout was also used to verify the patient received antibiotics and all appropriate instruments, implants and radiographic studies were available in the room. Once all in attendance were in agreement, the case began. The patient was prepped and  draped in a sterile fashion. He received preoperative antibiotics.  An incision was made proximal to the greater trochanter in line with the femur. A guidewire was placed over the tip of the greater trochanter and advanced into the proximal femur to the level of the lesser trochanter.  Confirmation of the drill pin position was made on AP and lateral C-arm images.  The threaded guidepin was then overdrilled with the proximal femoral drill.  The nail was then inserted into the proximal femur, across the fracture site and into the femoral shaft. Its position was confirmed on AP and lateral C-arm images.   Once the nail was completely seated, the drill guide for the lag screw was placed through the guide arm for the Affixus nail. A guidepin was then placed through this drill guide and advanced through the lateral cortex of the femur, across the fracture site and into the femoral head achieving a tip apex distance of less than 25 mm. The length of the drill pin was measured to be 105mm, and then the drill for the lag screw was advanced through the lateral cortex, across the fracture site and up into the femoral head to the depth of the lag screw..  The lag screw was then advanced by hand into position across the fracture site into the femoral head. Its final position was confirmed on AP and lateral C-arm images. Compression was applied as traction was carefully released. The set screw in the top of the intramedullary rod was tightened by hand using a screwdriver. It was backed off a quarter turn to allow for compression at the fracture site.  The drill sleeve for the distal interlocking screw was  then placed through the Affixus guide arm. A small stab incision was made to allow the drill guide to approximate the lateral cortex of the femur. The drill for the distal interlocking screw was then advanced bicortically. The depth of this drill was measured to be 40mm. A distal interlocking screw 40mm  was then inserted  by hand through the guide arm. Final C-arm images of the entire intramedullary construct were taken in both the AP and lateral planes.   The wounds were irrigated copiously and closed with 0 Vicryl for closure of the deep fascia and 2-0 Vicryl for subcutaneous closure. The skin was approximated with staples. A dry sterile dressing was applied. I was scrubbed and present the entire case and all sharp and instrument counts were correct at the conclusion of the case. Patient was transferred to hospital bed and brought to PACU in stable condition. I spoke with the patient's family in the postop consultation room to let them know the case had gone without complication and the patient was stable in the recovery room.   Kathreen DevoidKevin L Lorel Lembo, MD

## 2016-04-18 NOTE — Consult Note (Signed)
[facility]  CARDIOLOGY CONSULT NOTE  Patient ID: Nathaniel Fisher MRN: [id number] DOB/AGE: 10/20/1925 82 y.o.  Admit date: 04/18/2016 Referring Physician Dr. Imogene Burnhen Primary Physician   Primary Cardiologist Dr. Gwen PoundsKowalski Reason for Consultation Abnormal troponin and Pre-Op risk assessment  HPI: Patient is an 82 year old male with history of sick sinus syndrome status post permanent pacemaker placed one year ago. He also has a history of hypertension. He suffered what appears to have been a mechanical fall causing him to fracture his right hip.  He denied syncope or presyncope. He apparently had to crawl a fair distance from where he fell to his chair to drag himself up on the chair. He was evaluated in the emergency room. Per protocol troponin draws revealed a mildly elevated serum troponin of 0.1. Serum potassium was 3.2 on admission. His renal function was normal. He denied chest pain prior to presentation and since. He is a very difficult historian. EKG revealed atrial paced ventricular sensed rhythm. He is on lisinopril at home. He is hemodynamically stable. Echocardiogram done as an outpatient revealed normal LV function with no high-grade valvular abnormalities.    Review of Systems  Constitutional: Negative.   HENT: Positive for hearing loss.   Eyes: Negative.   Respiratory: Negative.   Cardiovascular: Negative.   Gastrointestinal: Negative.   Genitourinary: Negative.   Musculoskeletal: Positive for falls, joint pain and myalgias.  Skin: Negative.   Neurological: Negative.   Endo/Heme/Allergies: Negative.   Psychiatric/Behavioral: Negative.     Past Medical History:  Diagnosis Date  . Hypertension   . Stroke [facility](HCC)     No family history on file.  Social History   Social History  . Marital status: Widowed    Spouse name: N/A  . Number of children: N/A  . Years of education: N/A   Occupational History  . Not on file.   Social  History Main Topics  . Smoking status: Former Games developermoker  . Smokeless tobacco: Current User  . Alcohol use No  . Drug use: No  . Sexual activity: Not on file   Other Topics Concern  . Not on file   Social History Narrative  . No narrative on file    Past Surgical History:  Procedure Laterality Date  . CHOLECYSTECTOMY    . ESOPHAGEAL DILATION     "9 or 10 years ago"  . HERNIA REPAIR    . PACEMAKER INSERTION       Prescriptions Prior to Admission  Medication Sig Dispense Refill Last Dose  . ferrous sulfate 325 (65 FE) MG tablet Take 325 mg by mouth daily with breakfast.     . lisinopril (PRINIVIL,ZESTRIL) 10 MG tablet TAKE 1 TABLET BY MOUTH EVERY DAY 30 tablet 1   . lovastatin (MEVACOR) 20 MG tablet Take 20 mg by mouth at bedtime.     . sertraline (ZOLOFT) 25 MG tablet Take 25 mg by mouth daily.       Physical Exam: Blood pressure (!) 155/79, pulse 71, temperature 98.7 F (37.1 C), temperature source Oral, resp. rate 19, height 5\' 9"  (1.753 m), weight 73.7 kg (162 lb 8 oz), SpO2 96 %.   Wt Readings from Last 1 Encounters:  04/18/16 73.7 kg (162 lb 8 oz)     General appearance: cooperative Resp: clear to auscultation bilaterally Cardio: regular rate and rhythm GI: soft, non-tender; bowel sounds normal; no masses,  no organomegaly Extremities: Right hip and leg pain Neurologic: Grossly normal  Labs:  Lab Results  Component Value Date   WBC 11.4 (H) 04/18/2016   HGB 13.0 04/18/2016   HCT 37.7 (L) 04/18/2016   MCV 89.4 04/18/2016   PLT 203 04/18/2016    Recent Labs Lab 04/18/16 0603  NA 138  K 3.0*  CL 102  CO2 30  BUN 6  CREATININE 0.86  CALCIUM 8.3*  PROT 6.1*  BILITOT 0.4  ALKPHOS 52  ALT 15*  AST 38  GLUCOSE 128*   Lab Results  Component Value Date   CKTOTAL 93 12/29/2012   CKMB 2.4 12/29/2012   TROPONINI 0.08 (HH) 04/18/2016      Radiology: Chest x-ray revealed no acute cardiopulmonary abnormalities EKG  Atrial paced ventricular sensed with  no ischemic changes  ASESSMENT AND PLAN:  82 year old male with history of sick sinus syndrome status post permanent pacemaker which is functioning normally. [facility]. He also had a normal LV function by echo within the last year. He now presents with a mechanical fall with a right hip fracture. Trivial troponin elevation likely secondary to increased demand with the fall. He is hemodynamically stable. His LV function is normal. He is pacing. Would agree with metoprolol perioperatively and holding his outpatient lisinopril. Would proceed with surgery with no further cardiac workup as he is optimized as best possible at present with hemodynamic stability, normal LV function by recent echo. His pacemaker is functioning normally. Signed: Dalia HeadingKenneth A Fath MD, [facility] 04/18/2016, 11:48 AM

## 2016-04-18 NOTE — OR Nursing (Signed)
Patient is very sleepy  vss nasal trumpet remains

## 2016-04-18 NOTE — Anesthesia Procedure Notes (Addendum)
Spinal  Patient location during procedure: OR Start time: 04/18/2016 4:21 PM End time: 04/18/2016 4:26 PM Staffing Anesthesiologist: Yves DillARROLL, Aryeh Butterfield Performed: anesthesiologist  Preanesthetic Checklist Completed: patient identified, site marked, surgical consent, pre-op evaluation, timeout performed, IV checked, risks and benefits discussed and monitors and equipment checked Spinal Block Patient position: sitting Prep: Betadine and site prepped and draped Patient monitoring: heart rate, cardiac monitor, continuous pulse ox and blood pressure Approach: right paramedian Location: L3-4 Injection technique: single-shot Needle Needle type: Quincke  Needle gauge: 25 G Needle length: 9 cm Assessment Sensory level: T8 Additional Notes Time out called .  Patient placed in sitting position.  Back prepped and draped in sterile fashion.  A skin wheal was made in the L3-L4 interspace with 1% Lidocaine plain ( paramedian ).  A 25G Quincke needle was guided into the subarachnoid space with the return of clear, colorless CSF in all 4 quadrants.  12mg  of Marcaine + 4 mg of tetracaine + epi 1: 200 K was injected.  The patient tolerated the procedure well.  No blood or paresthesias.

## 2016-04-18 NOTE — H&P (Signed)
Nathaniel Fisher is an 82 y.o. male.   Chief Complaint: Fall HPI: The patient with past medical history of hypertension and stroke presents emergency department after suffering a fall in his home. He was unable to walk after his unwitnessed fall but was able to drag himself to the kitchen and lift himself into a chair. The patient complained of right hip and groin pain at that time. X-ray of the hip in the emergency department showed fractured hip. Laboratory evaluation showed mildly elevated troponin as well. Vital signs remained stable without EKG changes on telemetry; orthopedic surgery was contacted and requested the hospitalist service to admit for preoperative evaluation.  Past Medical History:  Diagnosis Date  . Hypertension   . Stroke [facility])     Past Surgical History:  Procedure Laterality Date  . CHOLECYSTECTOMY    . ESOPHAGEAL DILATION     "9 or 10 years ago"  . HERNIA REPAIR    . PACEMAKER INSERTION      No family history on file. 11 siblings many of whom deceased for heart disease and/or diabetes. Social History:  reports that he has quit smoking. He uses smokeless tobacco. He reports that he does not drink alcohol or use drugs.  Allergies: No Known Allergies  Medications Prior to Admission  Medication Sig Dispense Refill  . ferrous sulfate 325 (65 FE) MG tablet Take 325 mg by mouth daily with breakfast.    . lisinopril (PRINIVIL,ZESTRIL) 10 MG tablet TAKE 1 TABLET BY MOUTH EVERY DAY 30 tablet 1  . lovastatin (MEVACOR) 20 MG tablet Take 20 mg by mouth at bedtime.    . sertraline (ZOLOFT) 25 MG tablet Take 25 mg by mouth daily.      Results for orders placed or performed during the hospital encounter of 04/18/16 (from the past 48 hour(s))  Basic metabolic panel     Status: Abnormal   Collection Time: 04/18/16  1:46 AM  Result Value Ref Range   Sodium 139 135 - 145 mmol/L   Potassium 3.2 (L) 3.5 - 5.1 mmol/L   Chloride 100 (L) 101 - 111 mmol/L   CO2 30 22 - 32 mmol/L    Glucose, Bld 135 (H) 65 - 99 mg/dL   BUN 6 6 - 20 mg/dL   Creatinine, Ser 1.04 0.61 - 1.24 mg/dL   Calcium 9.0 8.9 - 10.3 mg/dL   GFR calc non Af Amer >60 >60 mL/min   GFR calc Af Amer >60 >60 mL/min    Comment: (NOTE) The eGFR has been calculated using the CKD EPI equation. This calculation has not been validated in all clinical situations. eGFR's persistently <60 mL/min signify possible Chronic Kidney Disease.    Anion gap 9 5 - 15  Urinalysis complete, with microscopic ([facility] only)     Status: Abnormal   Collection Time: 04/18/16  1:46 AM  Result Value Ref Range   Color, Urine STRAW (A) YELLOW   APPearance CLEAR (A) CLEAR   Glucose, UA NEGATIVE NEGATIVE mg/dL   Bilirubin Urine NEGATIVE NEGATIVE   Ketones, ur NEGATIVE NEGATIVE mg/dL   Specific Gravity, Urine 1.004 (L) 1.005 - 1.030   Hgb urine dipstick NEGATIVE NEGATIVE   pH 7.0 5.0 - 8.0   Protein, ur NEGATIVE NEGATIVE mg/dL   Nitrite NEGATIVE NEGATIVE   Leukocytes, UA NEGATIVE NEGATIVE   RBC / HPF 0-5 0 - 5 RBC/hpf   WBC, UA NONE SEEN 0 - 5 WBC/hpf   Bacteria, UA NONE SEEN NONE SEEN   Squamous  Epithelial / LPF NONE SEEN NONE SEEN  CBC with Differential     Status: Abnormal   Collection Time: 04/18/16  1:47 AM  Result Value Ref Range   WBC 13.1 (H) 3.8 - 10.6 K/uL   RBC 4.67 4.40 - 5.90 MIL/uL   Hemoglobin 14.3 13.0 - 18.0 g/dL   HCT 42.0 40.0 - 52.0 %   MCV 90.0 80.0 - 100.0 fL   MCH 30.7 26.0 - 34.0 pg   MCHC 34.1 32.0 - 36.0 g/dL   RDW 14.1 11.5 - 14.5 %   Platelets 222 150 - 440 K/uL   Neutrophils Relative % 83 %   Neutro Abs 11.0 (H) 1.4 - 6.5 K/uL   Lymphocytes Relative 10 %   Lymphs Abs 1.4 1.0 - 3.6 K/uL   Monocytes Relative 5 %   Monocytes Absolute 0.6 0.2 - 1.0 K/uL   Eosinophils Relative 1 %   Eosinophils Absolute 0.1 0 - 0.7 K/uL   Basophils Relative 1 %   Basophils Absolute 0.1 0 - 0.1 K/uL  Troponin I     Status: Abnormal   Collection Time: 04/18/16  1:47 AM  Result Value Ref Range   Troponin  I 0.10 (HH) <0.03 ng/mL    Comment: CRITICAL RESULT CALLED TO, READ BACK BY AND VERIFIED WITH REBECCA LYNN AT 0116 04/18/16.PMH  Protime-INR     Status: None   Collection Time: 04/18/16  6:03 AM  Result Value Ref Range   Prothrombin Time 13.6 11.4 - 15.2 seconds   INR 1.04   CBC WITH DIFFERENTIAL     Status: Abnormal   Collection Time: 04/18/16  6:03 AM  Result Value Ref Range   WBC 11.4 (H) 3.8 - 10.6 K/uL   RBC 4.22 (L) 4.40 - 5.90 MIL/uL   Hemoglobin 13.0 13.0 - 18.0 g/dL   HCT 37.7 (L) 40.0 - 52.0 %   MCV 89.4 80.0 - 100.0 fL   MCH 30.8 26.0 - 34.0 pg   MCHC 34.5 32.0 - 36.0 g/dL   RDW 13.9 11.5 - 14.5 %   Platelets 203 150 - 440 K/uL   Neutrophils Relative % 83 %   Neutro Abs 9.3 (H) 1.4 - 6.5 K/uL   Lymphocytes Relative 9 %   Lymphs Abs 1.1 1.0 - 3.6 K/uL   Monocytes Relative 8 %   Monocytes Absolute 0.9 0.2 - 1.0 K/uL   Eosinophils Relative 0 %   Eosinophils Absolute 0.0 0 - 0.7 K/uL   Basophils Relative 0 %   Basophils Absolute 0.0 0 - 0.1 K/uL  APTT     Status: None   Collection Time: 04/18/16  6:03 AM  Result Value Ref Range   aPTT 26 24 - 36 seconds  Type and screen If not already done in ED     Status: None (Preliminary result)   Collection Time: 04/18/16  6:03 AM  Result Value Ref Range   ABO/RH(D) PENDING    Antibody Screen PENDING    Sample Expiration 04/21/2016    Dg Chest 1 View  Result Date: 04/18/2016 CLINICAL DATA:  Hip fracture.  [employer]. EXAM: CHEST 1 VIEW COMPARISON:  10/01/2014 FINDINGS: There are intact appearances of the transvenous cardiac leads. The lungs are clear. There is no pneumothorax or large effusion. IMPRESSION: No acute cardiopulmonary findings. Electronically Signed   By: Andreas Newport M.D.   On: 04/18/2016 02:59   Dg Hip Unilat With Pelvis 2-3 Views Right  Result Date: 04/18/2016 CLINICAL DATA:  [place]  EXAM: DG HIP (WITH OR WITHOUT PELVIS) 2-3V RIGHT COMPARISON:  None. FINDINGS: There is a transcervical right hip fracture. No  dislocation. Remote healed fracture deformities of the right pubic rami. IMPRESSION: Right hip fracture Electronically Signed   By: Andreas Newport M.D.   On: 04/18/2016 02:58    Review of Systems  Constitutional: Negative for chills and fever.  HENT: Negative for sore throat and tinnitus.   Eyes: Negative for blurred vision and redness.  Respiratory: Negative for cough and shortness of breath.   Cardiovascular: Negative for chest pain, palpitations, orthopnea and PND.  Gastrointestinal: Negative for abdominal pain, diarrhea, nausea and vomiting.  Genitourinary: Negative for dysuria, frequency and urgency.  Musculoskeletal: Positive for falls and joint pain. Negative for myalgias.  Skin: Negative for rash.       No lesions  Neurological: Negative for speech change, focal weakness and weakness.  Endo/Heme/Allergies: Does not bruise/bleed easily.       No temperature intolerance  Psychiatric/Behavioral: Negative for depression and suicidal ideas.    Blood pressure (!) 147/88, pulse 68, temperature 98.2 F (36.8 C), temperature source Oral, resp. rate 18, height 5' 9"  (1.753 m), weight 73.7 kg (162 lb 8 oz), SpO2 99 %. Physical Exam  Constitutional: He is oriented to person, place, and time. He appears well-developed and well-nourished. No distress.  HENT:  Head: Normocephalic and atraumatic.  Mouth/Throat: Oropharynx is clear and moist.  Eyes: Conjunctivae and EOM are normal. Pupils are equal, round, and reactive to light. No scleral icterus.  Neck: Normal range of motion. Neck supple. No JVD present. No tracheal deviation present. No thyromegaly present.  Cardiovascular: Normal rate, regular rhythm and normal heart sounds.  Exam reveals no gallop and no friction rub.   No murmur heard. Respiratory: Effort normal and breath sounds normal. No respiratory distress.  GI: Soft. Bowel sounds are normal. He exhibits no distension. There is no tenderness.  Genitourinary:  Genitourinary  Comments: Deferred  Musculoskeletal: Normal range of motion. He exhibits no edema.  Lymphadenopathy:    He has no cervical adenopathy.  Neurological: He is alert and oriented to person, place, and time. No cranial nerve deficit.  Skin: Skin is warm and dry. No rash noted. No erythema.  Psychiatric: He has a normal mood and affect. His behavior is normal. Judgment and thought content normal.     Assessment/Plan This is an 82 year old male admitted for hip fracture. 1. Hip fracture: Right femoral neck; stabilization orders per orthopedic service. The patient is high risk for surgery due to history of heart disease and advanced age. 2. Elevated troponin: Likely secondary to demand ischemia (exertion following fall as the patient attempted to drive himself across the floor and up to the kitchen table). He denies chest pain or shortness of breath. EKG is unchanged from prior. Follow cardiac biomarkers. Continue to monitor telemetry. Cardiology consult placed. Continue lovastatin and lisinopril for risk reduction. 3. Depression: Continue Zoloft 4. DVT prophylaxis: Aspirin for now 5. GI prophylaxis: None The patient is a full code. Time spent on admission was inpatient care approximately 45 minutes  Harrie Foreman, MD 04/18/2016, 6:46 AM

## 2016-04-18 NOTE — Consult Note (Signed)
ORTHOPAEDIC CONSULTATION  REQUESTING PHYSICIAN: Shaune PollackQing Chen, MD  Chief Complaint: Right hip pain status post fall  HPI: Nathaniel Fisher is a 82 y.o. male who complains of  right hip pain after mechanical fall at home. The fall was unwitnessed according to the ER notes. Patient is seen in his hospital room earlier this afternoon with his family at the bedside. Patient had elevated troponin upon arrival to the ED. He has been seen by Dr. Lady GaryFath from cardiology who was cleared him for surgery.  Past Medical History:  Diagnosis Date  . Hypertension   . Stroke [facility](HCC)    Past Surgical History:  Procedure Laterality Date  . CHOLECYSTECTOMY    . ESOPHAGEAL DILATION     "9 or 10 years ago"  . HERNIA REPAIR    . PACEMAKER INSERTION     Social History   Social History  . Marital status: Widowed    Spouse name: N/A  . Number of children: N/A  . Years of education: N/A   Social History Main Topics  . Smoking status: Former Games developermoker  . Smokeless tobacco: Current User  . Alcohol use No  . Drug use: No  . Sexual activity: Not Asked   Other Topics Concern  . None   Social History Narrative  . None   History reviewed. No pertinent family history. No Known Allergies Prior to Admission medications   Medication Sig Start Date End Date Taking? Authorizing Provider  ferrous sulfate 325 (65 FE) MG tablet Take 325 mg by mouth daily with breakfast.    Historical Provider, MD  lisinopril (PRINIVIL,ZESTRIL) 10 MG tablet TAKE 1 TABLET BY MOUTH EVERY DAY    Karie Schwalbeichard I Letvak, MD  lovastatin (MEVACOR) 20 MG tablet Take 20 mg by mouth at bedtime.    Historical Provider, MD  sertraline (ZOLOFT) 25 MG tablet Take 25 mg by mouth daily.    Historical Provider, MD   Dg Chest 1 View  Result Date: 04/18/2016 CLINICAL DATA:  Hip fracture.  [employer]ell tonight. EXAM: CHEST 1 VIEW COMPARISON:  10/01/2014 FINDINGS: There are intact appearances of the transvenous cardiac leads. The lungs are clear. There is no  pneumothorax or large effusion. IMPRESSION: No acute cardiopulmonary findings. Electronically Signed   By: Ellery Plunkaniel R Mitchell M.D.   On: 04/18/2016 02:59   Dg Hip Unilat With Pelvis 2-3 Views Right  Result Date: 04/18/2016 CLINICAL DATA:  Larey SeatFell EXAM: DG HIP (WITH OR WITHOUT PELVIS) 2-3V RIGHT COMPARISON:  None. FINDINGS: There is a transcervical right hip fracture. No dislocation. Remote healed fracture deformities of the right pubic rami. IMPRESSION: Right hip fracture Electronically Signed   By: Ellery Plunkaniel R Mitchell M.D.   On: 04/18/2016 02:58    Positive ROS: All other systems have been reviewed and were otherwise negative with the exception of those mentioned in the HPI and as above.  Physical Exam: General: Alert, no acute distress, hard of hearing  MUSCULOSKELETAL: Right lower extremity: Patient's skin is intact overlying the right hip. There is no significant erythema or ecchymosis or swelling. His thigh compartments are soft and compressible. He has no calf tenderness or lower leg edema. He had palpable pedal pulses, intact sensation light touch and intact motor function. He did not have significant shortening of the right lower extremity.  Assessment: Right basicervical hip fracture  Plan: I explained to the patient and his family that he has sustained a right hip fracture. I'm recommending intramedullary fixation for this fracture.  I reviewed with him the details  of the operation as well as the postoperative course. They are aware that the risks of surgery include infection, bleeding requiring blood transfusion, nerve or blood vessel injury, hardware failure or screw cut out, avascular necrosis, persistent hip pain, change in lower extremity rotation or leg length discrepancy and the need for further surgery. They also understand the risks include but are not limited to DVT and pulmonary embolism, myocardial infarction, stroke, pneumonia, respiratory failure and death. The patient and his  family understood and agreed with this plan for surgery. They understand the benefits of surgery are to help the patient's pain and hopefully help him return to ambulation. They however understand that returning to ambulation is not guaranteed. I reviewed all the patient's lab work and radial graphic studies in preparation for this case. He has been cleared by cardiology for surgery.    Juanell FairlyKRASINSKI, Sarya Linenberger, MD    04/18/2016 3:44 PM

## 2016-04-18 NOTE — ED Notes (Signed)
Pt transported to room 236 

## 2016-04-18 NOTE — Transfer of Care (Signed)
Immediate Anesthesia Transfer of Care Note  Patient: Nathaniel Fisher  Procedure(s) Performed: Procedure(s): INTRAMEDULLARY (IM) NAIL INTERTROCHANTRIC (Right)  Patient Location: PACU  Anesthesia Type:Spinal  Level of Consciousness: awake and sedated  Airway & Oxygen Therapy: Patient Spontanous Breathing and Patient connected to face mask oxygen  Post-op Assessment: Report given to RN and Post -op Vital signs reviewed and stable  Post vital signs: Reviewed and stable  Last Vitals:  Vitals:   04/18/16 0851 04/18/16 1336  BP: (!) 155/79 (!) 160/77  Pulse: 71 70  Resp: 19 18  Temp: 37.1 C 36.4 C    Last Pain:  Vitals:   04/18/16 1336  TempSrc: Oral         Complications: No apparent anesthesia complications

## 2016-04-18 NOTE — Progress Notes (Signed)
Patient will be transferred to 1a ortho post procedure. Report called to [facility]ennifer R[facility].

## 2016-04-18 NOTE — Progress Notes (Signed)
[facility] [place] [facility]   PATIENT NAME: Nathaniel Fisher    MR#:  [id number]  DATE OF BIRTH:  1926/02/07  SUBJECTIVE:  CHIEF COMPLAINT:   Chief Complaint  Patient presents with  . Fall   No complaint. The patient is very hard of hearing. REVIEW OF SYSTEMS:  Review of Systems  Unable to perform ROS: Other   The patient is very hard of hearing. DRUG ALLERGIES:  No Known Allergies VITALS:  Blood pressure (!) 160/77, pulse 70, temperature 97.5 F (36.4 C), temperature source Oral, resp. rate 18, height 5\' 9"  (1.753 m), weight 162 lb 8 oz (73.7 kg), SpO2 99 %. PHYSICAL EXAMINATION:  Physical Exam LABORATORY PANEL:   CBC  Recent Labs Lab 04/18/16 0603  WBC 11.4*  HGB 13.0  HCT 37.7*  PLT 203   ------------------------------------------------------------------------------------------------------------------ Chemistries   Recent Labs Lab 04/18/16 0146 04/18/16 0603  NA 139 138  K 3.2* 3.0*  CL 100* 102  CO2 30 30  GLUCOSE 135* 128*  BUN 6 6  CREATININE 1.04 0.86  CALCIUM 9.0 8.3*  MG 2.1  --   AST  --  38  ALT  --  15*  ALKPHOS  --  52  BILITOT  --  0.4   RADIOLOGY:  Dg Chest 1 View  Result Date: 04/18/2016 CLINICAL DATA:  Hip fracture.  [employer]ell tonight. EXAM: CHEST 1 VIEW COMPARISON:  10/01/2014 FINDINGS: There are intact appearances of the transvenous cardiac leads. The lungs are clear. There is no pneumothorax or large effusion. IMPRESSION: No acute cardiopulmonary findings. Electronically Signed   By: Ellery Plunkaniel R Mitchell M.D.   On: 04/18/2016 02:59   Dg Hip Unilat With Pelvis 2-3 Views Right  Result Date: 04/18/2016 CLINICAL DATA:  Larey SeatFell EXAM: DG HIP (WITH OR WITHOUT PELVIS) 2-3V RIGHT COMPARISON:  None. FINDINGS: There is a transcervical right hip fracture. No dislocation. Remote healed fracture deformities of the right pubic rami. IMPRESSION: Right hip fracture Electronically Signed   By: Ellery Plunkaniel R Mitchell M.D.   On: 04/18/2016  02:58   ASSESSMENT AND PLAN:   This is an 82 year old male admitted for hip fracture. 1. Hip fracture: Right femoral neck. Cleared to by cardiologist for operation today.  Pain control, PT and DVT prophylaxis after surgery.  2. Elevated troponin: Likely secondary to demand ischemia (exertion following fall as the patient attempted to drive himself across the floor and up to the kitchen table). He denies chest pain or shortness of breath. EKG is unchanged from prior.  Stable troponin.  Continue to monitor telemetry. Continue lovastatin, start lopressor and discontinue lisinopril. Per Dr. Lady GaryFath, agree with metoprolol perioperatively and holding his outpatient lisinopril. Would proceed with surgery with no further cardiac workup as he is optimized as best possible at present with hemodynamic stability, normal LV function by recent echo. His pacemaker is functioning normally.   3. Depression: Continue Zoloft 4. DVT prophylaxis: After surgery.  Hypertension. On Lopressor. Hypokalemia. Give potassium supplement and follow-up BMP.  I discussed with Dr. Martha ClanKrasinski and Dr. Lady GaryFath. All the records are reviewed and case discussed with Care Management/Social Worker. Management plans discussed with the patient, family and they are in agreement.  CODE STATUS: Full code  TOTAL TIME TAKING CARE OF THIS PATIENT: 42 minutes.   More than 50% of the time was spent in counseling/coordination of care: YES  POSSIBLE D/C IN 3 DAYS, DEPENDING ON CLINICAL CONDITION.   Shaune Pollackhen, Willma Obando M.D on 04/18/2016 at 2:58 PM  Between 7am  to 6pm - Pager - [phone]  After 6pm go to [email or web address] - Social research officer, governmentpassword EPAS [email or web address]  [facility] [place] [facility]  Office  [phone]  CC: Primary care physician; Barbette ReichmannHANDE,VISHWANATH, MD  Note: This dictation was prepared with Dragon dictation along with smaller phrase technology. Any transcriptional errors that result from this process are unintentional.

## 2016-04-18 NOTE — Anesthesia Procedure Notes (Signed)
Performed by: COOK-MARTIN, Maeci Kalbfleisch Pre-anesthesia Checklist: Patient identified, Emergency Drugs available, Suction available, Patient being monitored and Timeout performed Patient Re-evaluated:Patient Re-evaluated prior to inductionOxygen Delivery Method: Nasal cannula Preoxygenation: Pre-oxygenation with 100% oxygen Intubation Type: IV induction Placement Confirmation: positive ETCO2 and CO2 detector       

## 2016-04-18 NOTE — OR Nursing (Signed)
1820 nasal trumpet is out

## 2016-04-18 NOTE — Progress Notes (Signed)
Subjective:  POST-OP CHECK:  Patient sleeping comfortably.  His family is at the bedside.    Objective:   VITALS:   Vitals:   04/18/16 0851 04/18/16 1336 04/18/16 1748 04/18/16 1830  BP: (!) 155/79 (!) 160/77 (!) 116/53   Pulse: 71 70 71 70  Resp: 19 18  12   Temp: 98.7 F (37.1 C) 97.5 F (36.4 C) 98 F (36.7 C)   TempSrc: Oral Oral    SpO2: 96% 99% 99% 100%  Weight:      Height:        PHYSICAL EXAM:  Deferred as patient is sleeping.   LABS  Results for orders placed or performed during the hospital encounter of 04/18/16 (from the past 24 hour(s))  Basic metabolic panel     Status: Abnormal   Collection Time: 04/18/16  1:46 AM  Result Value Ref Range   Sodium 139 135 - 145 mmol/L   Potassium 3.2 (L) 3.5 - 5.1 mmol/L   Chloride 100 (L) 101 - 111 mmol/L   CO2 30 22 - 32 mmol/L   Glucose, Bld 135 (H) 65 - 99 mg/dL   BUN 6 6 - 20 mg/dL   Creatinine, Ser 4.091.04 0.61 - 1.24 mg/dL   Calcium 9.0 8.9 - 81.110.3 mg/dL   GFR calc non Af Amer >60 >60 mL/min   GFR calc Af Amer >60 >60 mL/min   Anion gap 9 5 - 15  Urinalysis complete, with microscopic ([facility] only)     Status: Abnormal   Collection Time: 04/18/16  1:46 AM  Result Value Ref Range   Color, Urine STRAW (A) YELLOW   APPearance CLEAR (A) CLEAR   Glucose, UA NEGATIVE NEGATIVE mg/dL   Bilirubin Urine NEGATIVE NEGATIVE   Ketones, ur NEGATIVE NEGATIVE mg/dL   Specific Gravity, Urine 1.004 (L) 1.005 - 1.030   Hgb urine dipstick NEGATIVE NEGATIVE   pH 7.0 5.0 - 8.0   Protein, ur NEGATIVE NEGATIVE mg/dL   Nitrite NEGATIVE NEGATIVE   Leukocytes, UA NEGATIVE NEGATIVE   RBC / HPF 0-5 0 - 5 RBC/hpf   WBC, UA NONE SEEN 0 - 5 WBC/hpf   Bacteria, UA NONE SEEN NONE SEEN   Squamous Epithelial / LPF NONE SEEN NONE SEEN  Magnesium     Status: None   Collection Time: 04/18/16  1:46 AM  Result Value Ref Range   Magnesium 2.1 1.7 - 2.4 mg/dL  CBC with Differential     Status: Abnormal   Collection Time: 04/18/16  1:47 AM   Result Value Ref Range   WBC 13.1 (H) 3.8 - 10.6 K/uL   RBC 4.67 4.40 - 5.90 MIL/uL   Hemoglobin 14.3 13.0 - 18.0 g/dL   HCT 91.442.0 78.240.0 - 95.652.0 %   MCV 90.0 80.0 - 100.0 fL   MCH 30.7 26.0 - 34.0 pg   MCHC 34.1 32.0 - 36.0 g/dL   RDW 21.314.1 08.611.5 - 57.814.5 %   Platelets 222 150 - 440 K/uL   Neutrophils Relative % 83 %   Neutro Abs 11.0 (H) 1.4 - 6.5 K/uL   Lymphocytes Relative 10 %   Lymphs Abs 1.4 1.0 - 3.6 K/uL   Monocytes Relative 5 %   Monocytes Absolute 0.6 0.2 - 1.0 K/uL   Eosinophils Relative 1 %   Eosinophils Absolute 0.1 0 - 0.7 K/uL   Basophils Relative 1 %   Basophils Absolute 0.1 0 - 0.1 K/uL  Troponin I     Status: Abnormal   Collection  Time: 04/18/16  1:47 AM  Result Value Ref Range   Troponin I 0.10 (HH) <0.03 ng/mL  Protime-INR     Status: None   Collection Time: 04/18/16  6:03 AM  Result Value Ref Range   Prothrombin Time 13.6 11.4 - 15.2 seconds   INR 1.04   CBC WITH DIFFERENTIAL     Status: Abnormal   Collection Time: 04/18/16  6:03 AM  Result Value Ref Range   WBC 11.4 (H) 3.8 - 10.6 K/uL   RBC 4.22 (L) 4.40 - 5.90 MIL/uL   Hemoglobin 13.0 13.0 - 18.0 g/dL   HCT 16.137.7 (L) 09.640.0 - 04.552.0 %   MCV 89.4 80.0 - 100.0 fL   MCH 30.8 26.0 - 34.0 pg   MCHC 34.5 32.0 - 36.0 g/dL   RDW 40.913.9 81.111.5 - 91.414.5 %   Platelets 203 150 - 440 K/uL   Neutrophils Relative % 83 %   Neutro Abs 9.3 (H) 1.4 - 6.5 K/uL   Lymphocytes Relative 9 %   Lymphs Abs 1.1 1.0 - 3.6 K/uL   Monocytes Relative 8 %   Monocytes Absolute 0.9 0.2 - 1.0 K/uL   Eosinophils Relative 0 %   Eosinophils Absolute 0.0 0 - 0.7 K/uL   Basophils Relative 0 %   Basophils Absolute 0.0 0 - 0.1 K/uL  Comprehensive metabolic panel     Status: Abnormal   Collection Time: 04/18/16  6:03 AM  Result Value Ref Range   Sodium 138 135 - 145 mmol/L   Potassium 3.0 (L) 3.5 - 5.1 mmol/L   Chloride 102 101 - 111 mmol/L   CO2 30 22 - 32 mmol/L   Glucose, Bld 128 (H) 65 - 99 mg/dL   BUN 6 6 - 20 mg/dL   Creatinine, Ser  7.820.86 0.61 - 1.24 mg/dL   Calcium 8.3 (L) 8.9 - 10.3 mg/dL   Total Protein 6.1 (L) 6.5 - 8.1 g/dL   Albumin 3.6 3.5 - 5.0 g/dL   AST 38 15 - 41 U/L   ALT 15 (L) 17 - 63 U/L   Alkaline Phosphatase 52 38 - 126 U/L   Total Bilirubin 0.4 0.3 - 1.2 mg/dL   GFR calc non Af Amer >60 >60 mL/min   GFR calc Af Amer >60 >60 mL/min   Anion gap 6 5 - 15  APTT     Status: None   Collection Time: 04/18/16  6:03 AM  Result Value Ref Range   aPTT 26 24 - 36 seconds  Type and screen If not already done in ED     Status: None   Collection Time: 04/18/16  6:03 AM  Result Value Ref Range   ABO/RH(D) A POS    Antibody Screen NEG    Sample Expiration 04/21/2016   TSH     Status: None   Collection Time: 04/18/16  6:03 AM  Result Value Ref Range   TSH 1.806 0.350 - 4.500 uIU/mL  Troponin I     Status: Abnormal   Collection Time: 04/18/16  6:03 AM  Result Value Ref Range   Troponin I 0.08 (HH) <0.03 ng/mL  Troponin I     Status: Abnormal   Collection Time: 04/18/16 11:24 AM  Result Value Ref Range   Troponin I 0.10 (HH) <0.03 ng/mL  MRSA PCR Screening     Status: None   Collection Time: 04/18/16 12:22 PM  Result Value Ref Range   MRSA by PCR NEGATIVE NEGATIVE    Dg Chest 1  View  Result Date: 04/18/2016 CLINICAL DATA:  Hip fracture.  [employer]. EXAM: CHEST 1 VIEW COMPARISON:  10/01/2014 FINDINGS: There are intact appearances of the transvenous cardiac leads. The lungs are clear. There is no pneumothorax or large effusion. IMPRESSION: No acute cardiopulmonary findings. Electronically Signed   By: Ellery Plunk M.D.   On: 04/18/2016 02:59   Dg Hip Port Unilat With Pelvis 1v Right  Result Date: 04/18/2016 CLINICAL DATA:  Postop right hip fracture fixation. EXAM: DG HIP (WITH OR WITHOUT PELVIS) 1V PORT RIGHT COMPARISON:  04/18/2016 FINDINGS: There is a intra medullary gamma nail with a proximal compression screw with hip and a distal interlocking screw. Good position and alignment without  complicating features. IMPRESSION: Internal fixation hardware in good position without complicating features. Electronically Signed   By: Rudie Meyer M.D.   On: 04/18/2016 18:58   Dg Hip Unilat With Pelvis 2-3 Views Right  Result Date: 04/18/2016 CLINICAL DATA:  Larey Seat EXAM: DG HIP (WITH OR WITHOUT PELVIS) 2-3V RIGHT COMPARISON:  None. FINDINGS: There is a transcervical right hip fracture. No dislocation. Remote healed fracture deformities of the right pubic rami. IMPRESSION: Right hip fracture Electronically Signed   By: Ellery Plunk M.D.   On: 04/18/2016 02:58    Assessment/Plan: Day of Surgery   Active Problems:   Elevated troponin    The patient is stable post-op.  He will receive 24 hours of antibiotics.  I have reviewed the post-op xrays and the fracture is well reduced and hardware well positioned.  Labs will be checked in the AM and he will begin PT.  Juanell Fairly , MD 04/18/2016, 7:17 PM

## 2016-04-18 NOTE — ED Triage Notes (Signed)
Per [facility] pt lives alone at home and called son tonight due to unwitnessed fall. Pt c/o right groin pain upon excursion. Per EMS BP was 177/105, temp 98.9, and CBG 220. Pt is alert and oriented at this time.

## 2016-04-19 ENCOUNTER — Encounter: Payer: Self-pay | Admitting: Orthopedic Surgery

## 2016-04-19 LAB — CBC
HCT: 35.6 % — ABNORMAL LOW (ref 40.0–52.0)
Hemoglobin: 12.5 g/dL — ABNORMAL LOW (ref 13.0–18.0)
MCH: 31.3 pg (ref 26.0–34.0)
MCHC: 35 g/dL (ref 32.0–36.0)
MCV: 89.3 fL (ref 80.0–100.0)
PLATELETS: 177 10*3/uL (ref 150–440)
RBC: 3.99 MIL/uL — AB (ref 4.40–5.90)
RDW: 13.8 % (ref 11.5–14.5)
WBC: 9.2 10*3/uL (ref 3.8–10.6)

## 2016-04-19 LAB — URINE CULTURE

## 2016-04-19 LAB — HEMOGLOBIN A1C
Hgb A1c MFr Bld: 5.7 % — ABNORMAL HIGH (ref 4.8–5.6)
Mean Plasma Glucose: 117 mg/dL

## 2016-04-19 LAB — BASIC METABOLIC PANEL
Anion gap: 7 (ref 5–15)
BUN: 7 mg/dL (ref 6–20)
CO2: 27 mmol/L (ref 22–32)
CREATININE: 0.87 mg/dL (ref 0.61–1.24)
Calcium: 7.9 mg/dL — ABNORMAL LOW (ref 8.9–10.3)
Chloride: 105 mmol/L (ref 101–111)
GFR calc Af Amer: >60 mL/min (ref >60)
GLUCOSE: 133 mg/dL — AB (ref 65–99)
POTASSIUM: 3.8 mmol/L (ref 3.5–5.1)
SODIUM: 139 mmol/L (ref 135–145)

## 2016-04-19 MED ORDER — METOPROLOL TARTRATE 25 MG PO TABS
25.0000 mg | ORAL_TABLET | Freq: Two times a day (BID) | ORAL | Status: DC
  Administered 2016-04-19 – 2016-04-22 (×6): 25 mg via ORAL
  Filled 2016-04-19 (×6): qty 1

## 2016-04-19 MED ORDER — METOPROLOL TARTRATE 25 MG PO TABS
12.5000 mg | ORAL_TABLET | Freq: Two times a day (BID) | ORAL | Status: DC
Start: 2016-04-19 — End: 2016-04-19
  Administered 2016-04-19: 12.5 mg via ORAL
  Filled 2016-04-19: qty 1
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
## 2016-04-19 NOTE — Evaluation (Signed)
Occupational Therapy Evaluation Patient Details Name: Nathaniel SabalWillard P Fisher MRN: [id number] DOB: 1926/02/26 Today's Date: 04/19/2016    History of Present Illness Pt. is an 82 y.o. male who was [facility] after sustaining a fall at home. Pt. is now s/p IM nailing of the right hip.   Clinical Impression   Pt. Is an 82 y.o. male who was [facility] s/p sustaining a fall at home. Pt. Is now s/p IM nailing of the right hip. Pt. presents with limited hearing, pain, decreased ROM, and decreased functional mobility for ADLs, and IADLs. Pt. could benefit from skilled OT services for ADL training, functional mobility, and pt. family education about A/E use for LE ADLs. Pt. plans to go to SNF level of care.    Follow Up Recommendations  SNF    Equipment Recommendations       Recommendations for Other Services PT consult     Precautions / Restrictions Precautions Precautions: Fall Restrictions Weight Bearing Restrictions: Yes RLE Weight Bearing: Weight bearing as tolerated              ADL Overall ADL's : Needs assistance/impaired Eating/Feeding: Set up   Grooming: Set up               Lower Body Dressing: Maximal assistance                       Vision     Perception     Praxis      Pertinent Vitals/Pain Pain Assessment: 0-10 Pain Score: 6  Pain Location: Right Hip Pain Descriptors / Indicators: Grimacing;Guarding Pain Intervention(s): Limited activity within patient's tolerance;Monitored during session;Repositioned     Hand Dominance Right   Extremity/Trunk Assessment Upper Extremity Assessment Upper Extremity Assessment: Defer to OT evaluation           Communication Communication Communication: HOH   Cognition Arousal/Alertness: Awake/alert Behavior During Therapy: Anxious Overall Cognitive Status: History of cognitive impairments - at baseline                     General Comments       Exercises        Shoulder Instructions      Home Living Family/patient expects to be discharged to:: Skilled nursing facility Living Arrangements: Alone Available Help at Discharge: Personal care attendant;Available PRN/intermittently Type of Home: House Home Access: Ramped entrance  Tub shower. Shower chair   Home Layout: One level         FirefighterBathroom Toilet: Standard     Home Equipment: Environmental consultantWalker - 2 wheels;Shower seat;Grab bars - tub/shower          Prior Functioning/Environment Level of Independence: Needs assistance    ADL's / Homemaking Assistance Needed: Pt. had poersonal cargivers in to assist with cooking, cleaning, bathing, and dressing.            OT Problem List: Decreased strength;Pain;Decreased activity tolerance;Decreased knowledge of precautions   OT Treatment/Interventions: Self-care/ADL training;Therapeutic exercise;DME and/or AE instruction;Patient/family education;Therapeutic activities    OT Goals(Current goals can be found in the care plan section) Acute Rehab OT Goals Patient Stated Goal: To get better OT Goal Formulation: With patient Potential to Achieve Goals: Good  OT Frequency: Min 1X/week   Barriers to D/C:            Co-evaluation              End of Session    Activity Tolerance: Patient  tolerated treatment well Patient left: with call bell/phone within reach;in bed;with chair alarm set   Time: 1045-1110 OT Time Calculation (min): 25 min Charges:  OT General Charges $OT Visit: 1 Procedure OT Evaluation $OT Eval Low Complexity: 1 Procedure G-Codes:    Olegario MessierElaine Armani Brar, MS, OTR/L 04/19/2016, 1:32 PM

## 2016-04-19 NOTE — Anesthesia Postprocedure Evaluation (Signed)
Anesthesia Post Note  Patient: Nathaniel Fisher  Procedure(s) Performed: Procedure(s) (LRB): INTRAMEDULLARY (IM) NAIL INTERTROCHANTRIC (Right)  Patient location during evaluation: Nursing Unit Anesthesia Type: Spinal Level of consciousness: awake Pain management: pain level controlled Vital Signs Assessment: post-procedure vital signs reviewed and stable Respiratory status: spontaneous breathing Cardiovascular status: stable Anesthetic complications: no    Last Vitals:  Vitals:   04/19/16 0025 04/19/16 0519  BP: (!) 158/70 (!) 150/61  Pulse: 72 73  Resp: 20 (!) 22  Temp: 36.8 C 36.7 C    Last Pain:  Vitals:   04/19/16 0519  TempSrc: Oral  PainSc:                  Rica MastBachich,  Sherlie Boyum M

## 2016-04-19 NOTE — Progress Notes (Signed)
OT Cancellation Note  Patient Details Name: Lawerance SabalWillard P Devaul MRN: [id number] DOB: 08-Jan-1926   Cancelled Treatment:    Reason Eval/Treat Not Completed: OT screened, no needs identified, will sign off;Medical issues which prohibited therapy (Troponin levels trending upwards. Will continues to monitor, and eval as appropriate.)  Olegario MessierElaine Glender [place], MS, OTR/L 04/19/2016, 9:07 AM

## 2016-04-19 NOTE — Progress Notes (Signed)
Clinical Child psychotherapistocial Worker (CSW) contacted patient's daughter Lucendia HerrlichFaye and presented bed offers. D[facility]. [facility] liaison is aware of accepted bed offer. Plan is for patient to D/C to [facility] Wednesday 04/21/16 if medically stable. CSW will continue to follow and assist as needed.   Baker Hughes IncorporatedBailey Dakiya Puopolo, LCSW [phone]

## 2016-04-19 NOTE — Evaluation (Signed)
Physical Therapy Evaluation Patient Details Name: Nathaniel Fisher MRN: [id number] DOB: 10/24/1925 Today's Date: 04/19/2016   History of Present Illness  Pt is a 82 y/o M s/p fall at home and R femoral neck fx, now s/p IM nail R hip. Pt's PMH includes stroke, hernia repair, permanent pacemaker insertion.    Clinical Impression  Patient is s/p above surgery resulting in functional limitations due to the deficits listed below (see PT Problem List). Nathaniel Fisher is from home alone where he had a fall.  All information about PLOF and home layout provided by daughter as pt unable due to increased cognitive impairments compared to baseline.  He has an aide that comes to his home a few hours a day that assist with bathing, dressing, cooking, cleaning.  He is very HOH but refuses to wear his hearing aides.  Per daughter, no other falls in the past 6 months.  He currently requires max assist for bed mobility and mod +2 assist for sit<>stand and stand pivot transfers.  Recommending SNF at d/c for additional rehab. Patient will benefit from skilled PT to increase their independence and safety with mobility to allow discharge to the venue listed below.      Follow Up Recommendations SNF    Equipment Recommendations  None recommended by PT    Recommendations for Other Services OT consult     Precautions / Restrictions Precautions Precautions: Fall Restrictions Weight Bearing Restrictions: Yes RLE Weight Bearing: Weight bearing as tolerated      Mobility  Bed Mobility Overal bed mobility: Needs Assistance Bed Mobility: Supine to Sit     Supine to sit: Max assist;HOB elevated     General bed mobility comments: Cues for sequencing and education provided on reasoning for need to get OOB.  Pt uses bed rail.  He requires assist to advance BLEs to EOB and max assist to elevate trunk.  Bed pad used to assist pt in scooting EOB.  Transfers Overall transfer level: Needs assistance Equipment used:  Rolling walker (2 wheeled) Transfers: Sit to/from UGI CorporationStand;Stand Pivot Transfers Sit to Stand: Mod assist;+2 physical assistance Stand pivot transfers: Mod assist;+2 physical assistance;+2 safety/equipment       General transfer comment: Cues for technique and assist to boost to standing.  Once standing cues provided to stand upright which pt does not maintain.  He demonstrates a flexed posture and shuffles his feet.  Directional cues and pt attempts to sit several times prematurely.  Assist managing RW.  Ambulation/Gait             General Gait Details: unable to assess this visit  Stairs            Wheelchair Mobility    Modified Rankin (Stroke Patients Only)       Balance Overall balance assessment: Needs assistance;History of Falls Sitting-balance support: No upper extremity supported;Feet supported Sitting balance-Leahy Scale: Good     Standing balance support: Bilateral upper extremity supported;During functional activity Standing balance-Leahy Scale: Poor Standing balance comment: Relies on RW and physical assist to steady                             Pertinent Vitals/Pain Pain Assessment: Faces Faces Pain Scale: Hurts even more Pain Location: R hip Pain Descriptors / Indicators: Grimacing;Guarding;Moaning Pain Intervention(s): Limited activity within patient's tolerance;Monitored during session;Repositioned    Home Living Family/patient expects to be discharged to:: Skilled nursing facility Living Arrangements: Alone Available Help  at Discharge: Personal care attendant;Available PRN/intermittently Type of Home: House Home Access: Ramped entrance     Home Layout: One level Home Equipment: Walker - 2 wheels;Bedside commode;Shower seat;Grab bars - tub/shower Additional Comments: Pt has aide that assists with bathing, dressing, cooking, cleaning that comes for a few hours each day per daughter    Prior Function Level of Independence: Needs  assistance   Gait / Transfers Assistance Needed: Has been told that he needs to use his RW but per daughter, pt refuses  ADL's / Homemaking Assistance Needed: Needs assist with cooking, cleaning, dressing, bathing.  Is still driving.  Comments: Daughter denies that pt has had any additional falls in the past 6 months     Hand Dominance        Extremity/Trunk Assessment   Upper Extremity Assessment: Defer to OT evaluation           Lower Extremity Assessment: RLE deficits/detail RLE Deficits / Details: limited ROM and strength as expected s/p R hip surgery documented above    Cervical / Trunk Assessment: Kyphotic  Communication   Communication: HOH (very HOH.  Has hearing aides, per daughter refuses to wear)  Cognition Arousal/Alertness: Awake/alert Behavior During Therapy: Anxious Overall Cognitive Status: History of cognitive impairments - at baseline (although increase in AMS while in hospital)                      General Comments General comments (skin integrity, edema, etc.): Pt more willing to participate if reasoning for mobility explained prior to attempting.  Pt with increase RR upon sitting with SpO2 91% on RA.  WNL once pt resting in chair.    Exercises General Exercises - Lower Extremity Ankle Circles/Pumps: AROM;Both;10 reps;Seated Quad Sets: Other (comment) (pt unable to hear directions after repeated several times) Long [employer]rc Quad: AROM;10 reps;Seated;Right Hip ABduction/ADduction: AAROM;Right;10 reps;Seated Straight Leg Raises: AAROM;Right;10 reps;Seated   Assessment/Plan    PT Assessment Patient needs continued PT services  PT Problem List Decreased strength;Decreased range of motion;Decreased activity tolerance;Decreased balance;Decreased mobility;Decreased cognition;Decreased knowledge of use of DME;Decreased safety awareness;Pain          PT Treatment Interventions DME instruction;Gait training;Functional mobility training;Therapeutic  activities;Therapeutic exercise;Balance training;Neuromuscular re-education;Cognitive remediation;Patient/family education;Modalities    PT Goals (Current goals can be found in the Care Plan section)  Acute Rehab PT Goals Patient Stated Goal: none stated PT Goal Formulation: With family Time For Goal Achievement: 05/03/16 Potential to Achieve Goals: Fair    Frequency BID   Barriers to discharge Decreased caregiver support lives alone    Co-evaluation               End of Session Equipment Utilized During Treatment: Gait belt Activity Tolerance: Patient limited by pain;Patient limited by fatigue Patient left: in chair;with call bell/phone within reach;with chair alarm set;with family/visitor present;with nursing/sitter in room Nurse Communication: Mobility status;Weight bearing status (RN assisted with transfer)         Time: [id number] PT Time Calculation (min) (ACUTE ONLY): 38 min   Charges:   PT Evaluation $PT Eval Moderate Complexity: 1 Procedure PT Treatments $Therapeutic Exercise: 8-22 mins $Therapeutic Activity: 8-22 mins   PT G Codes:       Encarnacion ChuAshley Torien Ramroop PT, DPT 04/19/2016, 9:45 AM

## 2016-04-19 NOTE — Clinical Social Work Note (Signed)
Clinical Social Work Assessment  Patient Details  Name: Nathaniel Fisher MRN: [id number] Date of Birth: [birth year]  Date of referral:  04/19/16               Reason for consult:  Facility Placement                Permission sought to share information with:  Chartered certified accountant granted to share information::  Yes, Verbal Permission Granted  Name::      [place]::   [place]   Relationship::     Contact Information:     Housing/Transportation Living arrangements for the past 2 months:  [place] of Information:  Adult Children, Power of Attorney Patient Interpreter Needed:  None Criminal Activity/Legal Involvement Pertinent to Current Situation/Hospitalization:  No - Comment as needed Significant Relationships:  Adult Children, Other Family Members, Friend Lives with:  Self Do you feel safe going back to the place where you live?  Yes Need for family participation in patient care:  Yes (Comment)  Care giving concerns:  Patient lives alone in [place] in the [place]. [employer]. Patient has hired caregivers come into his home daily for a few hours.    Social Worker assessment / plan:  Holiday representative (CSW) received SNF consult. PT is recommending SNF. Patient confirmed with a bundle case manager that patient is not a bundle case. CSW met with patient's daughter [name] [phone] in the family waiting room on the first floor while patient was working with OT. [place] introduced self and explained role of [place] department. Daughter reported that she shares HPOA with her brother Geraldine Tesar. Per daughter patient lives alone in [place] and has hired caregivers a few hours a day. CSW explained that PT is recommending SNF and that he would require a 3 night qualifying inpatient stay at [facility] in order for Medicare to pay for SNF. Patient was admitted to inpatient on 04/18/16. Daughter verbalized her understanding and  is agreeable to SNF search. Daughter prefers [facility]. Daughter reported that patient has been to [facility] over 1 year ago and did not have a good experience. CSW provided support and explained the benefits of SNF.   FL2 complete and faxed out. CSW will continue to follow and assist as needed.   Employment status:  Retired Forensic scientist:  Medicare PT Recommendations:  [place] / Referral to community resources:  [place]  Patient/Family's Response to care:  Patient's daughter is agreeable to SNF search in [place] and prefers [facility].   Patient/Family's Understanding of and Emotional Response to Diagnosis, Current Treatment, and Prognosis:  Patient's daughter [name] expressed how tired she was and how she is trying to balance her work and take care of the patient. CSW provided emotional support.   Emotional Assessment Appearance:  Appears stated age Attitude/Demeanor/Rapport:  Unable to Assess Affect (typically observed):  Unable to Assess Orientation:  Oriented to Self, Oriented to Place, Fluctuating Orientation (Suspected and/or reported Sundowners) Alcohol / Substance use:  Not Applicable Psych involvement (Current and /or in the community):  No (Comment)  Discharge Needs  Concerns to be addressed:  Discharge Planning Concerns Readmission within the last 30 days:  No Current discharge risk:  Chronically ill, Cognitively Impaired, Dependent with Mobility, Lives alone Barriers to Discharge:  Continued Medical Work up   [employer], Veronia Beets, LCSW 04/19/2016, 11:42 AM

## 2016-04-19 NOTE — Progress Notes (Signed)
Subjective:  Postoperative day 1 status post intramedullary fixation for right hip fracture. Patient is sleeping comfortably.   Objective:   VITALS:   Vitals:   04/19/16 0025 04/19/16 0519 04/19/16 0749 04/19/16 1556  BP: (!) 158/70 (!) 150/61 (!) 162/68 129/60  Pulse: 72 73 82 69  Resp: 20 (!) 22 18 (!) 21  Temp: 98.3 F (36.8 C) 98.1 F (36.7 C) (!) 100.4 F (38 C) 98.6 F (37 C)  TempSrc: Oral Oral Oral Oral  SpO2: 95% 91% 95% 94%  Weight:      Height:        PHYSICAL EXAM:  Right lower extremity:  Dressing is clean dry and intact. Patient has no significant thigh swelling. Compartments are soft and compressible. Patient has palpable pedal pulses. Motor and sensory exam deferred due to patient's sleeping.   LABS  Results for orders placed or performed during the hospital encounter of 04/18/16 (from the past 24 hour(s))  CBC     Status: Abnormal   Collection Time: 04/19/16  4:20 AM  Result Value Ref Range   WBC 9.2 3.8 - 10.6 K/uL   RBC 3.99 (L) 4.40 - 5.90 MIL/uL   Hemoglobin 12.5 (L) 13.0 - 18.0 g/dL   HCT 14.735.6 (L) 82.940.0 - 56.252.0 %   MCV 89.3 80.0 - 100.0 fL   MCH 31.3 26.0 - 34.0 pg   MCHC 35.0 32.0 - 36.0 g/dL   RDW 13.013.8 86.511.5 - 78.414.5 %   Platelets 177 150 - 440 K/uL  Basic metabolic panel     Status: Abnormal   Collection Time: 04/19/16  4:20 AM  Result Value Ref Range   Sodium 139 135 - 145 mmol/L   Potassium 3.8 3.5 - 5.1 mmol/L   Chloride 105 101 - 111 mmol/L   CO2 27 22 - 32 mmol/L   Glucose, Bld 133 (H) 65 - 99 mg/dL   BUN 7 6 - 20 mg/dL   Creatinine, Ser 6.960.87 0.61 - 1.24 mg/dL   Calcium 7.9 (L) 8.9 - 10.3 mg/dL   GFR calc non Af Amer >60 >60 mL/min   GFR calc Af Amer >60 >60 mL/min   Anion gap 7 5 - 15    Dg Chest 1 View  Result Date: 04/18/2016 CLINICAL DATA:  Hip fracture.  Sun MicrosystemsFell tonight. EXAM: CHEST 1 VIEW COMPARISON:  10/01/2014 FINDINGS: There are intact appearances of the transvenous cardiac leads. The lungs are clear. There is no  pneumothorax or large effusion. IMPRESSION: No acute cardiopulmonary findings. Electronically Signed   By: Ellery Plunkaniel R Mitchell M.D.   On: 04/18/2016 02:59   Dg Hip Port Unilat With Pelvis 1v Right  Result Date: 04/18/2016 CLINICAL DATA:  Postop right hip fracture fixation. EXAM: DG HIP (WITH OR WITHOUT PELVIS) 1V PORT RIGHT COMPARISON:  04/18/2016 FINDINGS: There is a intra medullary gamma nail with a proximal compression screw with hip and a distal interlocking screw. Good position and alignment without complicating features. IMPRESSION: Internal fixation hardware in good position without complicating features. Electronically Signed   By: Rudie MeyerP.  Gallerani M.D.   On: 04/18/2016 18:58   Dg Hip Operative Unilat W Or W/o Pelvis Right  Result Date: 04/18/2016 CLINICAL DATA:  ORIF of right hip fracture EXAM: OPERATIVE RIGHTright HIP (WITH PELVIS IF PERFORMED) 5 VIEWS TECHNIQUE: Fluoroscopic spot image(s) were submitted for interpretation post-operatively. COMPARISON:  04/18/2016 FINDINGS: C-arm fluoroscopic views demonstrate an intra medullary nail fixation across a be easy cervical fracture of the right femur. Fine bony detail is  limited. Alignment appears near anatomic. No hardware failure is seen. IMPRESSION: New intramedullary nail fixation of right femoral neck fracture. Alignment appears near-anatomic. Electronically Signed   By: Tollie Ethavid  Kwon M.D.   On: 04/18/2016 19:37   Dg Hip Unilat With Pelvis 2-3 Views Right  Result Date: 04/18/2016 CLINICAL DATA:  Larey SeatFell EXAM: DG HIP (WITH OR WITHOUT PELVIS) 2-3V RIGHT COMPARISON:  None. FINDINGS: There is a transcervical right hip fracture. No dislocation. Remote healed fracture deformities of the right pubic rami. IMPRESSION: Right hip fracture Electronically Signed   By: Ellery Plunkaniel R Mitchell M.D.   On: 04/18/2016 02:58    Assessment/Plan: 1 Day Post-Op   Active Problems:   Elevated troponin  Patient is doing well postop from an orthopedic standpoint. Hemoglobin  and hematocrit are stable.  Attending physical therapy. B recheck in the a.m. Patient is completed 24 hours postop antibiotics. He is on Lovenox for DVT prophylaxis.    Juanell FairlyKRASINSKI, Levell Tavano , MD 04/19/2016, 7:44 PM

## 2016-04-19 NOTE — Clinical Social Work Placement (Signed)
   CLINICAL SOCIAL WORK PLACEMENT  NOTE  Date:  04/19/2016  Patient Details  Name: Nathaniel Fisher MRN: [id number] Date of Birth: [birth year]  Clinical Social Work is seeking post-discharge placement for this patient at the Skilled  Nursing Facility level of care (*CSW will initial, date and re-position this form in  chart as items are completed):  Yes   Patient/family provided with [facility] Clinical Social Work Department's list of facilities offering this level of care within the geographic area requested by the patient (or if unable, by the patient's family).  Yes   Patient/family informed of their freedom to choose among providers that offer the needed level of care, that participate in Medicare, Medicaid or managed care program needed by the patient, have an available bed and are willing to accept the patient.  Yes   Patient/family informed of [facility]'s ownership interest in [facility] and [facility], as well as of the fact that they are under no obligation to receive care at these facilities.  PASRR submitted to EDS on       PASRR number received on       Existing PASRR number confirmed on 04/19/16     FL2 transmitted to all facilities in geographic area requested by pt/family on 04/19/16     FL2 transmitted to all facilities within larger geographic area on       Patient informed that his/her managed care company has contracts with or will negotiate with certain facilities, including the following:            Patient/family informed of bed offers received.  Patient chooses bed at       Physician recommends and patient chooses bed at      Patient to be transferred to   on  .  Patient to be transferred to facility by       Patient family notified on   of transfer.  Name of family member notified:        PHYSICIAN       Additional Comment:    _______________________________________________ Jamieka Royle, Darleen CrockerBailey M, LCSW 04/19/2016, 11:41 AM

## 2016-04-19 NOTE — Progress Notes (Signed)
Patient catheter taken out at 8:05am. Patient had bloody urination in urinal. Patient family requested if pt could get a condom catheter for comfort because of his confusion and forgetful to let someone know when he has to urinate. MD notified. RN placed condom catheter. No new orders at this time.   Harvie HeckMelanie Reverie Vaquera, RN

## 2016-04-19 NOTE — Progress Notes (Signed)
[employer] [facility] - [place] at [facility]   PATIENT NAME: Nathaniel Fisher    MR#:  [id number]  DATE OF BIRTH:  May 27, 1926  SUBJECTIVE:  CHIEF COMPLAINT:   Chief Complaint  Patient presents with  . Fall   No complaint.  S/p hip surgery. REVIEW OF SYSTEMS:  Review of Systems  Unable to perform ROS: Other   The patient is very hard of hearing. DRUG ALLERGIES:  No Known Allergies VITALS:  Blood pressure (!) 162/68, pulse 82, temperature (!) 100.4 F (38 C), temperature source Oral, resp. rate 18, height 5\' 9"  (1.753 m), weight 162 lb 8 oz (73.7 kg), SpO2 95 %. PHYSICAL EXAMINATION:  Physical Exam  Constitutional: He is well-developed, well-nourished, and in no distress.  HENT:  Head: Normocephalic.  Mouth/Throat: Oropharynx is clear and moist.  The patient is very hard of hearing.  Eyes: Conjunctivae and EOM are normal.  Neck: Normal range of motion. Neck supple. No JVD present. No tracheal deviation present.  Cardiovascular: Normal rate, regular rhythm and normal heart sounds.  Exam reveals no gallop.   No murmur heard. Pulmonary/Chest: Effort normal and breath sounds normal. No respiratory distress. He has no wheezes.  Abdominal: Soft. Bowel sounds are normal. He exhibits no distension. There is no tenderness.  Musculoskeletal: He exhibits no edema or tenderness.  Neurological: He is alert.  Skin: No rash noted. No erythema.  Psychiatric: Affect normal.   LABORATORY PANEL:   CBC  Recent Labs Lab 04/19/16 0420  WBC 9.2  HGB 12.5*  HCT 35.6*  PLT 177   ------------------------------------------------------------------------------------------------------------------ Chemistries   Recent Labs Lab 04/18/16 0146 04/18/16 0603 04/19/16 0420  NA 139 138 139  K 3.2* 3.0* 3.8  CL 100* 102 105  CO2 30 30 27   GLUCOSE 135* 128* 133*  BUN 6 6 7   CREATININE 1.04 0.86 0.87  CALCIUM 9.0 8.3* 7.9*  MG 2.1  --   --   AST  --  38  --   ALT  --  15*  --    ALKPHOS  --  52  --   BILITOT  --  0.4  --    RADIOLOGY:  Dg Hip Port Unilat With Pelvis 1v Right  Result Date: 04/18/2016 CLINICAL DATA:  Postop right hip fracture fixation. EXAM: DG HIP (WITH OR WITHOUT PELVIS) 1V PORT RIGHT COMPARISON:  04/18/2016 FINDINGS: There is a intra medullary gamma nail with a proximal compression screw with hip and a distal interlocking screw. Good position and alignment without complicating features. IMPRESSION: Internal fixation hardware in good position without complicating features. Electronically Signed   By: Rudie MeyerP.  Gallerani M.D.   On: 04/18/2016 18:58   Dg Hip Operative Unilat W Or W/o Pelvis Right  Result Date: 04/18/2016 CLINICAL DATA:  ORIF of right hip fracture EXAM: OPERATIVE RIGHTright HIP (WITH PELVIS IF PERFORMED) 5 VIEWS TECHNIQUE: Fluoroscopic spot image(s) were submitted for interpretation post-operatively. COMPARISON:  04/18/2016 FINDINGS: C-arm fluoroscopic views demonstrate an intra medullary nail fixation across a be easy cervical fracture of the right femur. Fine bony detail is limited. Alignment appears near anatomic. No hardware failure is seen. IMPRESSION: New intramedullary nail fixation of right femoral neck fracture. Alignment appears near-anatomic. Electronically Signed   By: Tollie Ethavid  Kwon M.D.   On: 04/18/2016 19:37   ASSESSMENT AND PLAN:   This is an 82 year old male admitted for hip fracture. 1. Hip fracture: Right femoral neck. POD1.  Pain control, PT and DVT prophylaxis after surgery.  2. Elevated troponin:  Likely secondary to demand ischemia (exertion following fall as the patient attempted to drive himself across the floor and up to the kitchen table). He denies chest pain or shortness of breath. EKG is unchanged from prior.  Stable troponin.  Continue to monitor telemetry. Continue lovastatin, started lopressor and discontinued lisinopril. Per Dr. Lady GaryFath, agreed with metoprolol perioperatively and holding his outpatient  lisinopril.  3. Depression: Continue Zoloft 4. DVT prophylaxis: After surgery.  Hypertension. On Lopressor. Hypokalemia. Give potassium supplement and improved.  I discussed with Dr. Martha ClanKrasinski and Dr. Lady GaryFath. All the records are reviewed and case discussed with Care Management/Social Worker. Management plans discussed with the patient, family and they are in agreement.  CODE STATUS: Full code  TOTAL TIME TAKING CARE OF THIS PATIENT: 33 minutes.   More than 50% of the time was spent in counseling/coordination of care: YES  POSSIBLE D/C IN 3 DAYS, DEPENDING ON CLINICAL CONDITION.   Shaune Pollackhen, Tajah Schreiner M.D on 04/19/2016 at 1:51 PM  Between 7am to 6pm - Pager - [phone]  After 6pm go to [email or web address] - Social research officer, governmentpassword EPAS [facility]  [facility] [place] [facility]  Office  [phone]  CC: Primary care physician; Barbette ReichmannHANDE,VISHWANATH, MD  Note: This dictation was prepared with Dragon dictation along with smaller phrase technology. Any transcriptional errors that result from this process are unintentional.

## 2016-04-19 NOTE — Progress Notes (Signed)
Physical Therapy Treatment Patient Details Name: Nathaniel SabalWillard P Fisher MRN: [id number] DOB: Apr 06, 1926 Today's Date: 04/19/2016    History of Present Illness Pt is a 82 y/o M s/p fall at home and R femoral neck fx, now s/p IM nail R hip. Pt's PMH includes stroke, hernia repair, permanent pacemaker insertion.    PT Comments    Nathaniel Fisher is [facility] making [facility] with [facility].  He rarely follows verbal commands and requires physical assist/tactile cues for any mobility.  He required mod +2 assist to take 3 steps from the bed to the chair with a flexed posture and difficulty initiating movement.  He did not assist with sit>supine and required +2 total assist to do so.  SNF remains most appropriate d/c plan for continued rehab.  Pt will benefit from continued skilled PT services to increase functional independence and safety.   Follow Up Recommendations  SNF     Equipment Recommendations  None recommended by PT    Recommendations for Other Services       Precautions / Restrictions Precautions Precautions: Fall Restrictions Weight Bearing Restrictions: Yes RLE Weight Bearing: Weight bearing as tolerated    Mobility  Bed Mobility Overal bed mobility: Needs Assistance Bed Mobility: Sit to Supine       Sit to supine: +2 for physical assistance;Total assist   General bed mobility comments: Assist provided to trunk and for BLEs for sit>supine.  Pt does not assist, is unable to follow commands to perform bed mobility.  Transfers Overall transfer level: Needs assistance Equipment used: Rolling walker (2 wheeled) Transfers: Sit to/from Stand Sit to Stand: Mod assist;+2 physical assistance         General transfer comment: Pt unable to follow commands to scoot to the edge of the seat but initiates sit>stand with mod +2 assist to boost to standing.  Pt demonstrates flexed posture and requires cues for upright posture which pt is unable to  maintain.  He requires hand over hand for safe technique for stand>sit.  Ambulation/Gait Ambulation/Gait assistance: Mod assist;+2 physical assistance;+2 safety/equipment Ambulation Distance (Feet): 3 Feet Assistive device: Rolling walker (2 wheeled) Gait Pattern/deviations: Shuffle;Antalgic;Decreased weight shift to right;Decreased stance time - right;Decreased step length - left;Trunk flexed   Gait velocity interpretation: <1.8 ft/sec, indicative of risk for recurrent falls General Gait Details: Pt requires physical assist/tactile cues to initiate stepping with either LE. Does not follow verbal commands to take steps.  He demonstrates a flexed posture and WBing heavily through RW with it pushed too far ahead, requiring assist to keep it a safe distance.     Stairs            Wheelchair Mobility    Modified Rankin (Stroke Patients Only)       Balance Overall balance assessment: Needs assistance;History of Falls Sitting-balance support: Single extremity supported;Feet supported Sitting balance-Leahy Scale: Poor Sitting balance - Comments: Must have at least 1 UE supported while sitting EOB in addition to requiring mod>max assist due to posterior lean Postural control: Posterior lean Standing balance support: Bilateral upper extremity supported;During functional activity Standing balance-Leahy Scale: Poor Standing balance comment: Relies on RW and physical assist to steady                    Cognition Arousal/Alertness: Awake/alert Behavior During Therapy: Anxious Overall Cognitive Status: History of cognitive impairments - at baseline (although increase in AMS while in hospital)  Exercises General Exercises - Lower Extremity Ankle Circles/Pumps: AROM;Both;10 reps;Supine Quad Sets: Other (comment) (unable to hear/comprehend cues to complete) Gluteal Sets: Other (comment) (unable to hear/comprehend cues to complete) Long Arc Quad:  AROM;Seated;Right;5 reps Heel Slides: AAROM;Right;10 reps;Supine Hip ABduction/ADduction: AAROM;Right;10 reps;Supine Straight Leg Raises: AAROM;Right;10 reps;Supine    General Comments General comments (skin integrity, edema, etc.): Pt appears to respond better to his niece who is in the room during the session and was assisting by providing verbal cues.      Pertinent Vitals/Pain Pain Assessment: Faces Pain Score: 6  Faces Pain Scale: Hurts even more Pain Location: R hip Pain Descriptors / Indicators: Grimacing;Guarding;Moaning Pain Intervention(s): Limited activity within patient's tolerance;Monitored during session;Repositioned    Home Living Family/patient expects to be discharged to:: Skilled nursing facility Living Arrangements: Alone Available Help at Discharge: Personal care attendant;Available PRN/intermittently Type of Home: House Home Access: Ramped entrance   Home Layout: One level Home Equipment: Walker - 2 wheels;Shower seat;Grab bars - tub/shower      Prior Function Level of Independence: Needs assistance    ADL's / Homemaking Assistance Needed: Pt. had poersonal cargivers in to assist with cooking, cleaning, bathing, and dressing.     PT Goals (current goals can now be found in the care plan section) Acute Rehab PT Goals Patient Stated Goal: none stated PT Goal Formulation: With family Time For Goal Achievement: 05/03/16 Potential to Achieve Goals: Fair Progress towards PT goals: Not progressing toward goals - comment (due to AMS, fatigue, and pain)    Frequency    BID      PT Plan Current plan remains appropriate    Co-evaluation             End of Session Equipment Utilized During Treatment: Gait belt Activity Tolerance: Patient limited by pain;Patient limited by fatigue Patient left: with call bell/phone within reach;with family/visitor present;in bed;with bed alarm set;with SCD's reapplied     Time: [id number] PT Time Calculation  (min) (ACUTE ONLY): 28 min  Charges:  $Therapeutic Exercise: 8-22 mins $Therapeutic Activity: 8-22 mins                    G Codes:       Encarnacion ChuAshley Abashian PT, DPT 04/19/2016, 3:21 PM

## 2016-04-20 LAB — BASIC METABOLIC PANEL
Anion gap: 8 (ref 5–15)
BUN: 10 mg/dL (ref 6–20)
CHLORIDE: 102 mmol/L (ref 101–111)
CO2: 28 mmol/L (ref 22–32)
CREATININE: 0.92 mg/dL (ref 0.61–1.24)
Calcium: 8.5 mg/dL — ABNORMAL LOW (ref 8.9–10.3)
GFR calc Af Amer: >60 mL/min (ref >60)
GFR calc non Af Amer: >60 mL/min (ref >60)
GLUCOSE: 111 mg/dL — AB (ref 65–99)
POTASSIUM: 3.9 mmol/L (ref 3.5–5.1)
SODIUM: 138 mmol/L (ref 135–145)

## 2016-04-20 LAB — CBC
HEMATOCRIT: 39.3 % — AB (ref 40.0–52.0)
HEMOGLOBIN: 13.1 g/dL (ref 13.0–18.0)
MCH: 30.8 pg (ref 26.0–34.0)
MCHC: 33.3 g/dL (ref 32.0–36.0)
MCV: 92.4 fL (ref 80.0–100.0)
Platelets: 191 10*3/uL (ref 150–440)
RBC: 4.26 MIL/uL — AB (ref 4.40–5.90)
RDW: 14.1 % (ref 11.5–14.5)
WBC: 10.6 10*3/uL (ref 3.8–10.6)

## 2016-04-20 MED ORDER — PRAVASTATIN SODIUM 20 MG PO TABS
20.0000 mg | ORAL_TABLET | Freq: Every day | ORAL | Status: DC
  Administered 2016-04-21: 20 mg via ORAL
  Filled 2016-04-20: qty 1

## 2016-04-20 MED ORDER — LACTULOSE 10 GM/15ML PO SOLN: 20.0000 g | Freq: Two times a day (BID) | ORAL | Status: DC | PRN

## 2016-04-20 NOTE — Progress Notes (Signed)
Patient received pain medication x1 during the night shift after getting a bath performed by the nurse tech. Sacral dressing intact. Honeycomb dressing intact with minimal to no drainage present. Currently patient is resting in bed. Will continue to monitor patient to end of shift and report off to oncoming nurse.

## 2016-04-20 NOTE — Progress Notes (Signed)
Plan is for patient to D/C to [facility] tomorrow pending medical clearance. [facility] liaison is aware of above. Clinical Social Worker (CSW) will continue to follow and assist as needed.   Baker Hughes IncorporatedBailey Emersen Mascari, LCSW [phone]

## 2016-04-20 NOTE — Progress Notes (Signed)
[employer] [facility] - [place] at [facility]   PATIENT NAME: Nathaniel Fisher    MR#:  [id number]  DATE OF BIRTH:  09/08/1925  SUBJECTIVE:  CHIEF COMPLAINT:   Chief Complaint  Patient presents with  . Fall   No complaint, but looks confused. REVIEW OF SYSTEMS:  Review of Systems  Unable to perform ROS: Other   The patient is very hard of hearing. DRUG ALLERGIES:  No Known Allergies VITALS:  Blood pressure (!) 148/83, pulse 88, temperature 98.3 F (36.8 C), temperature source Oral, resp. rate 18, height 5\' 9"  (1.753 m), weight 162 lb 8 oz (73.7 kg), SpO2 95 %. PHYSICAL EXAMINATION:  Physical Exam  Constitutional: He is well-developed, well-nourished, and in no distress.  HENT:  Head: Normocephalic.  Mouth/Throat: Oropharynx is clear and moist.  The patient is very hard of hearing.  Eyes: Conjunctivae and EOM are normal.  Neck: Normal range of motion. Neck supple. No JVD present. No tracheal deviation present.  Cardiovascular: Normal rate, regular rhythm and normal heart sounds.  Exam reveals no gallop.   No murmur heard. Pulmonary/Chest: Effort normal and breath sounds normal. No respiratory distress. He has no wheezes.  Abdominal: Soft. Bowel sounds are normal. He exhibits no distension. There is no tenderness.  Musculoskeletal: He exhibits no edema or tenderness.  Neurological: He is alert.  Skin: No rash noted. No erythema.  Psychiatric:  Looks confused.   LABORATORY PANEL:   CBC  Recent Labs Lab 04/20/16 0410  WBC 10.6  HGB 13.1  HCT 39.3*  PLT 191   ------------------------------------------------------------------------------------------------------------------ Chemistries   Recent Labs Lab 04/18/16 0146 04/18/16 0603  04/20/16 0410  NA 139 138  < > 138  K 3.2* 3.0*  < > 3.9  CL 100* 102  < > 102  CO2 30 30  < > 28  GLUCOSE 135* 128*  < > 111*  BUN 6 6  < > 10  CREATININE 1.04 0.86  < > 0.92  CALCIUM 9.0 8.3*  < > 8.5*  MG 2.1  --    --   --   AST  --  38  --   --   ALT  --  15*  --   --   ALKPHOS  --  52  --   --   BILITOT  --  0.4  --   --   < > = values in this interval not displayed. RADIOLOGY:  No results found. ASSESSMENT AND PLAN:   This is an 82 year old male admitted for hip fracture. 1. Hip fracture: Right femoral neck. POD2.  Pain control, PT and DVT prophylaxis.  2. Elevated troponin: Likely secondary to demand ischemia (exertion following fall as the patient attempted to drive himself across the floor and up to the kitchen table). He denies chest pain or shortness of breath. EKG is unchanged from prior.  Stable troponin.  Continue to monitor telemetry. Continue lovastatin, started lopressor and discontinued lisinopril. Per Dr. Lady GaryFath, agreed with metoprolol perioperatively and holding his outpatient lisinopril.  3. Depression: Continue Zoloft 4. DVT prophylaxis: lovenox.  Hypertension. On Lopressor. Hypokalemia. Give potassium supplement and improved.  I discussed with Dr. Martha ClanKrasinski and Dr. Lady GaryFath. All the records are reviewed and case discussed with Care Management/Social Worker. Management plans discussed with the patient's son and they are in agreement.  CODE STATUS: Full code  TOTAL TIME TAKING CARE OF THIS PATIENT: 33 minutes.   More than 50% of the time was spent in counseling/coordination of  care: YES  POSSIBLE D/C IN 3 DAYS, DEPENDING ON CLINICAL CONDITION.   Shaune Pollackhen, Bedford Winsor M.D on 04/20/2016 at 2:50 PM  Between 7am to 6pm - Pager - [phone]  After 6pm go to [email or web address] - Social research officer, governmentpassword EPAS [facility]  [facility] [place] [facility]  Office  [phone]  CC: Primary care physician; Barbette ReichmannHANDE,VISHWANATH, MD  Note: This dictation was prepared with Dragon dictation along with smaller phrase technology. Any transcriptional errors that result from this process are unintentional.

## 2016-04-20 NOTE — Progress Notes (Signed)
Subjective:  POD #2 s/p IM fixation of right hip.  Patient reports right pain as mild to moderate.  He is up out of bed to a chair.  Objective:   VITALS:   Vitals:   04/19/16 2250 04/19/16 2254 04/20/16 0333 04/20/16 0900  BP: 135/60 135/60 (!) 141/68 (!) 148/83  Pulse: (!) 124 (!) 124 73 88  Resp:   19 18  Temp:   99 F (37.2 C) 98.3 F (36.8 C)  TempSrc:    Oral  SpO2:  93% 95% 95%  Weight:      Height:        PHYSICAL EXAM:  Right lower extremity:  Dressing C/D/I.  Neurovascular function intact.  Intact sensory and motor function.  Palpable pedal pulses.  LABS  Results for orders placed or performed during the hospital encounter of 04/18/16 (from the past 24 hour(s))  CBC     Status: Abnormal   Collection Time: 04/20/16  4:10 AM  Result Value Ref Range   WBC 10.6 3.8 - 10.6 K/uL   RBC 4.26 (L) 4.40 - 5.90 MIL/uL   Hemoglobin 13.1 13.0 - 18.0 g/dL   HCT 16.139.3 (L) 09.640.0 - 04.552.0 %   MCV 92.4 80.0 - 100.0 fL   MCH 30.8 26.0 - 34.0 pg   MCHC 33.3 32.0 - 36.0 g/dL   RDW 40.914.1 81.111.5 - 91.414.5 %   Platelets 191 150 - 440 K/uL  Basic metabolic panel     Status: Abnormal   Collection Time: 04/20/16  4:10 AM  Result Value Ref Range   Sodium 138 135 - 145 mmol/L   Potassium 3.9 3.5 - 5.1 mmol/L   Chloride 102 101 - 111 mmol/L   CO2 28 22 - 32 mmol/L   Glucose, Bld 111 (H) 65 - 99 mg/dL   BUN 10 6 - 20 mg/dL   Creatinine, Ser 7.820.92 0.61 - 1.24 mg/dL   Calcium 8.5 (L) 8.9 - 10.3 mg/dL   GFR calc non Af Amer >60 >60 mL/min   GFR calc Af Amer >60 >60 mL/min   Anion gap 8 5 - 15    Dg Hip Port Unilat With Pelvis 1v Right  Result Date: 04/18/2016 CLINICAL DATA:  Postop right hip fracture fixation. EXAM: DG HIP (WITH OR WITHOUT PELVIS) 1V PORT RIGHT COMPARISON:  04/18/2016 FINDINGS: There is a intra medullary gamma nail with a proximal compression screw with hip and a distal interlocking screw. Good position and alignment without complicating features. IMPRESSION: Internal fixation  hardware in good position without complicating features. Electronically Signed   By: Rudie MeyerP.  Gallerani M.D.   On: 04/18/2016 18:58   Dg Hip Operative Unilat W Or W/o Pelvis Right  Result Date: 04/18/2016 CLINICAL DATA:  ORIF of right hip fracture EXAM: OPERATIVE RIGHTright HIP (WITH PELVIS IF PERFORMED) 5 VIEWS TECHNIQUE: Fluoroscopic spot image(s) were submitted for interpretation post-operatively. COMPARISON:  04/18/2016 FINDINGS: C-arm fluoroscopic views demonstrate an intra medullary nail fixation across a be easy cervical fracture of the right femur. Fine bony detail is limited. Alignment appears near anatomic. No hardware failure is seen. IMPRESSION: New intramedullary nail fixation of right femoral neck fracture. Alignment appears near-anatomic. Electronically Signed   By: Tollie Ethavid  Kwon M.D.   On: 04/18/2016 19:37    Assessment/Plan: 2 Days Post-Op   Active Problems:   Elevated troponin  Patient is doing well from an orthopedic standpoint postop. He is up out of bed to a chair. Hemoglobin and hematocrit are stable. Patient's vital signs  are stable. Continue physical therapy. Patient may be discharged to rehabilitation from an orthopedic standpoint when cleared by medicine.    Juanell FairlyKRASINSKI, Jesscia Imm , MD 04/20/2016, 2:46 PM

## 2016-04-20 NOTE — Progress Notes (Signed)
Physical Therapy Treatment Patient Details Name: Nathaniel Fisher MRN: [id number] DOB: [birth year] Today's Date: 04/20/2016    History of Present Illness Pt is a 82 y/o M s/p fall at home and R femoral neck fx, now s/p IM nail R hip. Pt's PMH includes stroke, hernia repair, permanent pacemaker insertion.    PT Comments    Pt has demonstrated significant decrease in mobility despite no change in cognitive status since initial PT evaluation.  Pt required total +3 assist this afternoon to achieve sit<>stand and stand pivot to the bed.  Should pt not show improvement before next PT session, may need to use lift equipment for OOB to chair.  PT will continue to follow acutely.  SNF remains most appropriate d/c plan.   Follow Up Recommendations  SNF     Equipment Recommendations  None recommended by PT    Recommendations for Other Services       Precautions / Restrictions Precautions Precautions: Fall Restrictions Weight Bearing Restrictions: Yes RLE Weight Bearing: Weight bearing as tolerated    Mobility  Bed Mobility Overal bed mobility: Needs Assistance Bed Mobility: Sit to Supine     Supine to sit: Max assist;+2 for physical assistance;HOB elevated Sit to supine: Total assist;+2 for physical assistance   General bed mobility comments: Total +2 assist to manage BLEs and direct trunk to acheive sit>supine.  Pt does not participate.  Transfers Overall transfer level: Needs assistance Equipment used: Rolling walker (2 wheeled) Transfers: Sit to/from UGI CorporationStand;Stand Pivot Transfers Sit to Stand: +2 physical assistance;Total assist Stand pivot transfers: +2 physical assistance;Total assist       General transfer comment: Pt pushing strongly posteriorly in chair and requires total +3 assist for all aspects of sit<>stand and stand pivot transfer due to strong posterior bias and flexed posture.  Once standing pt demonstrates flexed posture with risk of falling and pt required total  +3 assist to pivot to chair and control descent to bed.    Ambulation/Gait             General Gait Details: unable   Stairs            Wheelchair Mobility    Modified Rankin (Stroke Patients Only)       Balance Overall balance assessment: Needs assistance Sitting-balance support: Feet supported;Bilateral upper extremity supported Sitting balance-Leahy Scale: Poor Sitting balance - Comments: Pushing strong posteriorly when sitting in chair and posterior bias when seated EOB   Standing balance support: Bilateral upper extremity supported;During functional activity Standing balance-Leahy Scale: Zero Standing balance comment: Pt requires total assist for transfer this afternoon                    Cognition Arousal/Alertness: Awake/alert Behavior During Therapy: Anxious Overall Cognitive Status: History of cognitive impairments - at baseline (although increase in AMS while in hospital)                      Exercises General Exercises - Lower Extremity Heel Slides: AAROM;Right;10 reps;Seated Hip ABduction/ADduction: AAROM;Right;10 reps;Seated Straight Leg Raises: AAROM;Right;10 reps;Seated    General Comments General comments (skin integrity, edema, etc.): Pt has demonstrated significant decrease in mobility despite no change in cognitive status since initial PT evaluation.  Pt required total +3 assist this afternoon to achieve sit<>stand and stand pivot to the bed.  Should pt not show improvement before next PT session, may need to use lift equipment for OOB to chair.      Pertinent  Vitals/Pain Pain Assessment: Faces Faces Pain Scale: Hurts whole lot Pain Location: R hip with mobility Pain Descriptors / Indicators: Grimacing;Guarding;Moaning Pain Intervention(s): Limited activity within patient's tolerance;Monitored during session;Repositioned    Home Living                      Prior Function            PT Goals (current goals  can now be found in the care plan section) Acute Rehab PT Goals Patient Stated Goal: none stated PT Goal Formulation: With family Time For Goal Achievement: 05/03/16 Potential to Achieve Goals: Fair Progress towards PT goals: Not progressing toward goals - comment (due to pt resisting any mobility)    Frequency    BID (may decrease frequency pending pt's participation level)      PT Plan Current plan remains appropriate    Co-evaluation             End of Session Equipment Utilized During Treatment: Gait belt Activity Tolerance: Patient limited by pain;Patient limited by fatigue Patient left: with call bell/phone within reach;in bed;with nursing/sitter in room;Other (comment) (RN washing up pt)     Time: 1610-96041448-1510 PT Time Calculation (min) (ACUTE ONLY): 22 min  Charges:  $Therapeutic Exercise: 8-22 mins $Therapeutic Activity: 8-22 mins                    G Codes:       Encarnacion ChuAshley Blas Riches PT, DPT 04/20/2016, 3:25 PM

## 2016-04-20 NOTE — Progress Notes (Signed)
Physical Therapy Treatment Patient Details Name: Nathaniel SabalWillard P [facility] MRN: [id number] DOB: [birth year] Today's Date: 04/20/2016    History of Present Illness Pt is a 82 y/o M s/p fall at home and R femoral neck fx, now s/p IM nail R hip. Pt's PMH includes stroke, hernia repair, permanent pacemaker insertion.    PT Comments    N[facility]iel [facility] is not making progress as he does not consistently follow cues or commands and is unable to initiate movement.  He requires max +2 assist for bed mobility and mod +2 assist for transfers.  Therapeutic exercises were completed with minimal participation from pt.  Pt will benefit from continued skilled PT services to increase functional independence and safety.   Follow Up Recommendations  SNF     Equipment Recommendations  None recommended by PT    Recommendations for Other Services       Precautions / Restrictions Precautions Precautions: Fall Restrictions Weight Bearing Restrictions: Yes RLE Weight Bearing: Weight bearing as tolerated    Mobility  Bed Mobility Overal bed mobility: Needs Assistance Bed Mobility: Supine to Sit     Supine to sit: Max assist;+2 for physical assistance;HOB elevated     General bed mobility comments: Assist to advance RLE to EOB and to elevate trunk.  Pt demonstrates posterior lean and cues provided for pt to lean anteriorly.  Assist to scoot to EOB.  Transfers Overall transfer level: Needs assistance Equipment used: Rolling walker (2 wheeled) Transfers: Sit to/from [employer];Stand Pivot Transfers Sit to Stand: Mod assist;+2 physical assistance Stand pivot transfers: Mod assist;+2 physical assistance       General transfer comment: Pt unable to follow commands to scoot to the edge of the seat but initiates sit>stand with mod +2 assist to boost to standing.  Pt demonstrates flexed posture and requires cues for upright posture which pt is unable to maintain.  He requires tactile cues and assist managing RW to  shuffle his feet to pivot to the chair and does not initiate movement.  He requires hand over hand for safe technique for stand>sit.  Ambulation/Gait             General Gait Details: unable as pt does not initiate stepping and continuously attempts to sit prematurely   Stairs            Wheelchair Mobility    Modified Rankin (Stroke Patients Only)       Balance Overall balance assessment: Needs assistance;History of Falls Sitting-balance support: Single extremity supported;Feet supported Sitting balance-Leahy Scale: Poor Sitting balance - Comments: Must have at least 1 UE supported while sitting EOB in addition to requiring mod>max assist due to posterior lean   Standing balance support: Bilateral upper extremity supported;During functional activity Standing balance-Leahy Scale: Poor Standing balance comment: Relies on RW and physical assist to steady                    Cognition Arousal/Alertness: Awake/alert Behavior During Therapy: Anxious Overall Cognitive Status: History of cognitive impairments - at baseline (although increase in AMS while in hospital)                      Exercises General Exercises - Lower Extremity Heel Slides: AAROM;Right;10 reps;Seated Hip ABduction/ADduction: AAROM;Right;10 reps;Seated Straight Leg Raises: AAROM;Right;10 reps;Seated    General Comments        Pertinent Vitals/Pain Pain Assessment: Faces Faces Pain Scale: Hurts even more Pain Location: R hip with mobility Pain Descriptors / Indicators:  Grimacing;Guarding;Moaning Pain Intervention(s): Limited activity within patient's tolerance;Monitored during session;Repositioned    Home Living                      Prior Function            PT Goals (current goals can now be found in the care plan section) Acute Rehab PT Goals Patient Stated Goal: none stated PT Goal Formulation: With family Time For Goal Achievement: 05/03/16 Potential to  Achieve Goals: Fair Progress towards PT goals: Not progressing toward goals - comment (due to impaired cognition and inability to follow cues)    Frequency    BID      PT Plan Current plan remains appropriate    Co-evaluation             End of Session Equipment Utilized During Treatment: Gait belt Activity Tolerance: Patient limited by pain;Patient limited by fatigue Patient left: with call bell/phone within reach;with family/visitor present;in chair;with chair alarm set     Time: 1137-1201 PT Time Calculation (min) (ACUTE ONLY): 24 min  Charges:  $Therapeutic Exercise: 8-22 mins $Therapeutic Activity: 8-22 mins                    G Codes:       Encarnacion ChuAshley Abashian PT, DPT 04/20/2016, 1:42 PM

## 2016-04-21 ENCOUNTER — Inpatient Hospital Stay: Payer: Medicare Other

## 2016-04-21 LAB — CBC
HCT: 35.7 % — ABNORMAL LOW (ref 40.0–52.0)
Hemoglobin: 12 g/dL — ABNORMAL LOW (ref 13.0–18.0)
MCH: 30.9 pg (ref 26.0–34.0)
MCHC: 33.8 g/dL (ref 32.0–36.0)
MCV: 91.4 fL (ref 80.0–100.0)
Platelets: 203 10*3/uL (ref 150–440)
RBC: 3.9 MIL/uL — AB (ref 4.40–5.90)
RDW: 14.1 % (ref 11.5–14.5)
WBC: 10.6 10*3/uL (ref 3.8–10.6)

## 2016-04-21 MED ORDER — HYDRALAZINE HCL 20 MG/ML IJ SOLN: 10.0000 mg | Freq: Four times a day (QID) | INTRAMUSCULAR | Status: DC | PRN

## 2016-04-21 MED ORDER — SODIUM CHLORIDE 0.9 % IV SOLN
INTRAVENOUS | Status: DC
  Administered 2016-04-21 – 2016-04-22 (×2): via INTRAVENOUS

## 2016-04-21 NOTE — Progress Notes (Signed)
Pts temp 101.9 (rectal). PRN Tylenol given (see Mar). MD Imogene Burnhen paged and notified. No new orders at this time

## 2016-04-21 NOTE — Progress Notes (Signed)
Physical Therapy Treatment Patient Details Name: Nathaniel SabalWillard P Aguinaldo MRN: [id number] DOB: 25-Jan-1926 Today's Date: 04/21/2016    History of Present Illness Pt is a 82 y/o M s/p fall at home and R femoral neck fx, now s/p IM nail R hip. Pt's PMH includes stroke, hernia repair, permanent pacemaker insertion.    PT Comments    Per RN and family pt has been combative and aggressive this morning but has since calmed down some and OK to attempt PT treatment session.  Granddaughter and daughter in law present during session and holding the hands of the pt to comfort him which appears effective.  Although he did resist and demonstrated muscle guarding with RLE exercises he was able to assist minimally at times.  Unable to attempt bed mobility or OOB this session due to pt's cognitive status and pain.  Pt will benefit from continued skilled PT services to increase functional independence and safety.    Follow Up Recommendations  SNF     Equipment Recommendations  None recommended by PT    Recommendations for Other Services       Precautions / Restrictions Precautions Precautions: Fall Restrictions Weight Bearing Restrictions: Yes RLE Weight Bearing: Weight bearing as tolerated    Mobility  Bed Mobility               General bed mobility comments: Unable to attempt  Transfers                    Ambulation/Gait                 Stairs            Wheelchair Mobility    Modified Rankin (Stroke Patients Only)       Balance                                    Cognition Arousal/Alertness: Awake/alert Behavior During Therapy: Anxious Overall Cognitive Status: History of cognitive impairments - at baseline (although increase in AMS while in hospital)                      Exercises General Exercises - Lower Extremity Ankle Circles/Pumps: AAROM;Both;10 reps;Supine Heel Slides: AAROM;Both;10 reps;Supine Hip ABduction/ADduction:  AAROM;10 reps;Both;Supine Straight Leg Raises: AAROM;Both;10 reps;Supine    General Comments General comments (skin integrity, edema, etc.): Pt agitated this morning and was resistive with some therapeutic exercises documented below.  Therefore session limited to exercises in bed and no OOB activity today.  SpO2 at or above 93% on RA and HR up to 111.      Pertinent Vitals/Pain Pain Assessment: Faces Faces Pain Scale: Hurts whole lot Pain Location: suspect R hip although pt unable to specify Pain Descriptors / Indicators: Grimacing;Guarding;Moaning Pain Intervention(s): Limited activity within patient's tolerance;Monitored during session    Home Living                      Prior Function            PT Goals (current goals can now be found in the care plan section) Acute Rehab PT Goals Patient Stated Goal: "Ooo!", "Ow" PT Goal Formulation: With family Time For Goal Achievement: 05/03/16 Potential to Achieve Goals: Fair Progress towards PT goals: Not progressing toward goals - comment (due to cognitive status and pain)    Frequency    7X/week (may  increase back to BID pending pt's participation level)      PT Plan Frequency needs to be updated    Co-evaluation             End of Session   Activity Tolerance: Patient limited by pain;Other (comment) (limited by cognitive status) Patient left: with call bell/phone within reach;in bed;with bed alarm set;with family/visitor present;with SCD's reapplied     Time: [id number] PT Time Calculation (min) (ACUTE ONLY): 15 min  Charges:  $Therapeutic Exercise: 8-22 mins                    G Codes:       Encarnacion ChuAshley Quientin Jent PT, DPT 04/21/2016, 11:52 AM

## 2016-04-21 NOTE — Progress Notes (Signed)
Per MD patient is not stable for D/C today. Clinical Social Worker (CSW) met with patient's son [facility]iel [facility] at his request. Son reported that he has concerns that patient is not eating and the MD is not addressing that. Son reported that he would like a new MD. CSW explained to son the his concerns would be expressed to the ortho Retail buyer. CSW made ortho nursing director aware of above. [facility] liaison is aware of above. Per [facility] patient's family has already completed admissions paper work at [facility]. CSW will continue to follow and assist as needed.   [employer], LCSW [phone]

## 2016-04-21 NOTE — Progress Notes (Signed)
  Subjective:  POD #3 s/p IM fixation for right hip fracture.  Patient reports pain as mild.  Patient hard of hearing. His family is at the bedside. Spoke with Dr. Imogene Burnhen from the hospitalist service who explains the patient has had poor intake and no bowel movement. He is going to be monitored overnight.  Objective:   VITALS:   Vitals:   04/21/16 0728 04/21/16 0755 04/21/16 1006 04/21/16 1215  BP: (!) 184/95 (!) 164/88 (!) 161/85 (!) 163/64  Pulse: 83   98  Resp: 20     Temp: 98.3 F (36.8 C)     TempSrc: Oral     SpO2: 98%  99% 96%  Weight:      Height:        PHYSICAL EXAM:  Right lower extremity: Patient's dressing has scant amount of dried blood. No active drainage. Right thigh compartments are soft and compressible. He has no significant swelling. There is no calf tenderness or lower leg edema. His palpable pedal pulses, intact sensation light touch.  Patient and dorsiflex and plantarflex his ankle and flex and extend his toes.   LABS  Results for orders placed or performed during the hospital encounter of 04/18/16 (from the past 24 hour(s))  CBC     Status: Abnormal   Collection Time: 04/21/16  6:45 AM  Result Value Ref Range   WBC 10.6 3.8 - 10.6 K/uL   RBC 3.90 (L) 4.40 - 5.90 MIL/uL   Hemoglobin 12.0 (L) 13.0 - 18.0 g/dL   HCT 16.135.7 (L) 09.640.0 - 04.552.0 %   MCV 91.4 80.0 - 100.0 fL   MCH 30.9 26.0 - 34.0 pg   MCHC 33.8 32.0 - 36.0 g/dL   RDW 40.914.1 81.111.5 - 91.414.5 %   Platelets 203 150 - 440 K/uL    No results found.  Assessment/Plan: 3 Days Post-Op   Active Problems:   Elevated troponin  Patient doing well from an orthopedic standpoint. Continue physical therapy. He'll be ready for discharge from an orthopedic standpoint whenever cleared by medicine. He may weight-bear as tolerated on the right lower extremity. He will follow up with me in the office in 10-14 days after discharge.    Juanell FairlyKRASINSKI, Rosabelle Jupin , MD 04/21/2016, 1:29 PM

## 2016-04-21 NOTE — Progress Notes (Addendum)
[facility] [place] [facility]   PATIENT NAME: Nathaniel [facility]    MR#:  [id number]  DATE OF BIRTH:  08-Jan-1926  SUBJECTIVE:  CHIEF COMPLAINT:   Chief Complaint  Patient presents with  . Fall   No complaint, has poor oral intake. REVIEW OF SYSTEMS:  Review of Systems  Unable to perform ROS: Other   The patient is very hard of hearing. DRUG ALLERGIES:  No Known Allergies VITALS:  Blood pressure (!) 163/64, pulse 98, temperature 98.3 F (36.8 C), temperature source Oral, resp. rate 20, height 5\' 9"  (1.753 m), weight 162 lb 8 oz (73.7 kg), SpO2 96 %. PHYSICAL EXAMINATION:  Physical Exam  Constitutional: He is well-developed, well-nourished, and in no distress.  HENT:  Head: Normocephalic.  Mouth/Throat: Oropharynx is clear and moist.  The patient is very hard of hearing.  Eyes: Conjunctivae and EOM are normal.  Neck: Normal range of motion. Neck supple. No JVD present. No tracheal deviation present.  Cardiovascular: Normal rate, regular rhythm and normal heart sounds.  Exam reveals no gallop.   No murmur heard. Pulmonary/Chest: Effort normal and breath sounds normal. No respiratory distress. He has no wheezes.  Abdominal: Soft. Bowel sounds are normal. He exhibits no distension. There is no tenderness.  Musculoskeletal: He exhibits no edema or tenderness.  Neurological: He is alert.  Skin: No rash noted. No erythema.  Psychiatric:  Looks confused.   LABORATORY PANEL:   CBC  Recent Labs Lab 04/21/16 0645  WBC 10.6  HGB 12.0*  HCT 35.7*  PLT 203   ------------------------------------------------------------------------------------------------------------------ Chemistries   Recent Labs Lab 04/18/16 0146 04/18/16 0603  04/20/16 0410  NA 139 138  < > 138  K 3.2* 3.0*  < > 3.9  CL 100* 102  < > 102  CO2 30 30  < > 28  GLUCOSE 135* 128*  < > 111*  BUN 6 6  < > 10  CREATININE 1.04 0.86  < > 0.92  CALCIUM 9.0 8.3*  < > 8.5*  MG 2.1   --   --   --   AST  --  38  --   --   ALT  --  15*  --   --   ALKPHOS  --  52  --   --   BILITOT  --  0.4  --   --   < > = values in this interval not displayed. RADIOLOGY:  No results found. ASSESSMENT AND PLAN:   This is an 82 year old male admitted for hip fracture. 1. Hip fracture: Right femoral neck. POD3.  Pain control, PT and DVT prophylaxis.  2. Elevated troponin: Likely secondary to demand ischemia (exertion following fall as the patient attempted to drive himself across the floor and up to the kitchen table). He denies chest pain or shortness of breath. EKG is unchanged from prior.  Stable troponin.  Continue to monitor telemetry. Continue lovastatin, started lopressor and discontinued lisinopril. Per Dr. Lady GaryFath, agreed with metoprolol perioperatively and holding his outpatient lisinopril.  3. Depression: Continue Zoloft 4. DVT prophylaxis: lovenox.  Hypertension. On Lopressor, hydralazine prn. Hypokalemia. Give potassium supplement and improved.  Poor oral intake. Soft diet, feed patient.  I discussed with Dr. Martha ClanKrasinski.  All the records are reviewed and case discussed with RN, Care Management/Social Worker. Management plans discussed with the patient's son and they are in agreement.  CODE STATUS: Full code  TOTAL TIME TAKING CARE OF THIS PATIENT: 45 minutes.   More  than 50% of the time was spent in counseling/coordination of care: YES  POSSIBLE D/C IN 1-2 DAYS, DEPENDING ON CLINICAL CONDITION.   Shaune Pollackhen, Draya Felker M.D on 04/21/2016 at 1:17 PM  Between 7am to 6pm - Pager - [phone]  After 6pm go to [email or web address] - Social research officer, governmentpassword [email or web address] [facility]  [facility] [place] [facility]  Office  [phone]  CC: Primary care physician; Barbette ReichmannHANDE,VISHWANATH, MD  Note: This dictation was prepared with Dragon dictation along with smaller phrase technology. Any transcriptional errors that result from this process are unintentional.

## 2016-04-22 LAB — URINALYSIS COMPLETE WITH MICROSCOPIC (ARMC ONLY)
BACTERIA UA: NONE SEEN
BILIRUBIN URINE: NEGATIVE
Glucose, UA: NEGATIVE mg/dL
HGB URINE DIPSTICK: NEGATIVE
LEUKOCYTES UA: NEGATIVE
Nitrite: NEGATIVE
PH: 8 (ref 5.0–8.0)
Protein, ur: 30 mg/dL — AB
Specific Gravity, Urine: 1.011 (ref 1.005–1.030)

## 2016-04-22 MED ORDER — OXYCODONE HCL 5 MG PO TABS: 5.0000 mg | ORAL_TABLET | Freq: Four times a day (QID) | ORAL | 0 refills | Status: DC | PRN

## 2016-04-22 MED ORDER — MORPHINE SULFATE (PF) 2 MG/ML IV SOLN: 1.0000 mg | INTRAVENOUS | Status: DC | PRN

## 2016-04-22 MED ORDER — METOPROLOL TARTRATE 25 MG PO TABS: 25.0000 mg | ORAL_TABLET | Freq: Two times a day (BID) | ORAL | 1 refills | Status: AC

## 2016-04-22 MED ORDER — ENOXAPARIN SODIUM 40 MG/0.4ML ~~LOC~~ SOLN: 40.0000 mg | SUBCUTANEOUS | 0 refills | Status: DC

## 2016-04-22 NOTE — Care Management Important Message (Signed)
Important Message  Patient Details  Name: Nathaniel [facility] MRN: [id number] Date of Birth: [birth year]   Medicare Important Message Given:  Yes    [name], RN 04/22/2016, 9:30 AM

## 2016-04-22 NOTE — Clinical Social Work Placement (Signed)
   CLINICAL SOCIAL WORK PLACEMENT  NOTE  Date:  04/22/2016  Patient Details  Name: Nathaniel [facility] P Redwine MRN: [id number] Date of Birth: Feb 09, 1926  Clinical Social Work is seeking post-discharge placement for this patient at the Skilled  Nursing Facility level of care (*CSW will initial, date and re-position this form in  chart as items are completed):  Yes   Patient/family provided with [facility] Clinical Social Work Department's list of facilities offering this level of care within the geographic area requested by the patient (or if unable, by the patient's family).  Yes   Patient/family informed of their freedom to choose among providers that offer the needed level of care, that participate in Medicare, Medicaid or managed care program needed by the patient, have an available bed and are willing to accept the patient.  Yes   Patient/family informed of [facility]'s ownership interest in [facility] and [facility], as well as of the fact that they are under no obligation to receive care at these facilities.  PASRR submitted to EDS on       PASRR number received on       Existing PASRR number confirmed on 04/19/16     FL2 transmitted to all facilities in geographic area requested by pt/family on 04/19/16     FL2 transmitted to all facilities within larger geographic area on       Patient informed that his/her managed care company has contracts with or will negotiate with certain facilities, including the following:        Yes   Patient/family informed of bed offers received.  Patient chooses bed at  ([facility] )     Physician recommends and patient chooses bed at      Patient to be transferred to  ([facility] ) on 04/22/16.  Patient to be transferred to facility by  [facility]     Patient family notified on 04/22/16 of transfer.  Name of family member notified:   (Patient's son Brett CanalesSteve is at bedside and aware of D/C today. CSW contacted patient's daughter Lucendia HerrlichFaye and  made her aware of D/C today. )     PHYSICIAN       Additional Comment:    _______________________________________________ Shamir Tuzzolino, Darleen CrockerBailey M, LCSW 04/22/2016, 10:56 AM

## 2016-04-22 NOTE — Progress Notes (Signed)
[facility]. EMS called for transportation.

## 2016-04-22 NOTE — Discharge Summary (Signed)
[facility] - [place] [facility]   PATIENT NAME: Nathaniel Fisher    MR#:  [id number]  DATE OF BIRTH:  1925-07-03  DATE OF ADMISSION:  04/18/2016 ADMITTING PHYSICIAN: Arnaldo NatalMichael S Diamond, MD  DATE OF DISCHARGE: 04/22/16  PRIMARY CARE PHYSICIAN: Barbette ReichmannHANDE,VISHWANATH, MD    ADMISSION DIAGNOSIS:  Fall [W19.XXXA] Closed fracture of right hip, initial encounter (HCC) [S72.001A]  DISCHARGE DIAGNOSIS:  Right hip fracture s/p surgery POD #4 HTN Impaired hearing  SECONDARY DIAGNOSIS:   Past Medical History:  Diagnosis Date  . Hypertension   . Stroke [facility](HCC)     HOSPITAL COURSE:  Nathaniel Fisher is a 82 y.o. male who complains of  right hip pain after mechanical fall at home  1. Hip fracture: Right femoral neck. POD4  Pain control, PT and DVT prophylaxis. -overall doing well -had BM y'day  2. Elevated troponin: Likely secondary to demand ischemia (exertion following fall as the patient attempted to drive himself across the floor and up to the kitchen table). He denies chest pain or shortness of breath. EKG is unchanged from prior.  Stable troponin.   -Continue lovastatin, on lopressor and lisinopril.   3. Depression: Continue Zoloft  4. DVT prophylaxis: lovenox. Total 14 days  5.Hypokalemia. repleted  Overall improving D/w pt's son and granddter in the room  they are agreeable with d/c to rehab  CONSULTS OBTAINED:    DRUG ALLERGIES:  No Known Allergies  DISCHARGE MEDICATIONS:   Current Discharge Medication List    START taking these medications   Details  enoxaparin (LOVENOX) 40 MG/0.4ML injection Inject 0.4 mLs (40 mg total) into the skin daily. Qty: 14 Syringe, Refills: 0    metoprolol tartrate (LOPRESSOR) 25 MG tablet Take 1 tablet (25 mg total) by mouth 2 (two) times daily. Qty: 60 tablet, Refills: 1    oxyCODONE (OXY IR/ROXICODONE) 5 MG immediate release tablet Take 1-2 tablets (5-10 mg total) by mouth every 6 (six) hours as  needed for breakthrough pain ((for MODERATE breakthrough pain)). Qty: 15 tablet, Refills: 0      CONTINUE these medications which have NOT CHANGED   Details  ferrous sulfate 325 (65 FE) MG tablet Take 325 mg by mouth daily with breakfast.    lisinopril (PRINIVIL,ZESTRIL) 10 MG tablet TAKE 1 TABLET BY MOUTH EVERY DAY Qty: 30 tablet, Refills: 1    lovastatin (MEVACOR) 20 MG tablet Take 20 mg by mouth at bedtime.    sertraline (ZOLOFT) 25 MG tablet Take 25 mg by mouth daily.        If you experience worsening of your admission symptoms, develop shortness of breath, life threatening emergency, suicidal or homicidal thoughts you must seek medical attention immediately by calling 911 or calling your MD immediately  if symptoms less severe.  You Must read complete instructions/literature along with all the possible adverse reactions/side effects for all the Medicines you take and that have been prescribed to you. Take any new Medicines after you have completely understood and accept all the possible adverse reactions/side effects.   Please note  You were cared for by a hospitalist during your hospital stay. If you have any questions about your discharge medications or the care you received while you were in the hospital after you are discharged, you can call the unit and asked to speak with the hospitalist on call if the hospitalist that took care of you is not available. Once you are discharged, your primary care physician will handle any further medical issues.  Please note that NO REFILLS for any discharge medications will be authorized once you are discharged, as it is imperative that you return to your primary care physician (or establish a relationship with a primary care physician if you do not have one) for your aftercare needs so that they can reassess your need for medications and monitor your lab values. Today   SUBJECTIVE   Impaired hearing. No new complaints  VITAL SIGNS:  Blood  pressure (!) 159/48, pulse (!) 41, temperature 97.7 F (36.5 C), temperature source Oral, resp. rate 17, height 5\' 9"  (1.753 m), weight 73.7 kg (162 lb 8 oz), SpO2 100 %.  I/O:   Intake/Output Summary (Last 24 hours) at 04/22/16 0933 Last data filed at 04/22/16 0900  Gross per 24 hour  Intake           1007.5 ml  Output              425 ml  Net            582.5 ml    PHYSICAL EXAMINATION:  GENERAL:  82 y.o.-year-old patient lying in the bed with no acute distress.  EYES: Pupils equal, round, reactive to light and accommodation. No scleral icterus. Extraocular muscles intact.  HEENT: Head atraumatic, normocephalic. Oropharynx and nasopharynx clear.  NECK:  Supple, no jugular venous distention. No thyroid enlargement, no tenderness.  LUNGS: Normal breath sounds bilaterally, no wheezing, rales,rhonchi or crepitation. No use of accessory muscles of respiration.  CARDIOVASCULAR: S1, S2 normal. No murmurs, rubs, or gallops.  ABDOMEN: Soft, non-tender, non-distended. Bowel sounds present. No organomegaly or mass.  EXTREMITIES: No pedal edema, cyanosis, or clubbing.  NEUROLOGIC: Cranial nerves II through XII are intact. Muscle strength 5/5 in all extremities. Sensation intact. Gait not checked.  PSYCHIATRIC: The patient is alert and oriented x 3.  SKIN: No obvious rash, lesion, or ulcer.   DATA REVIEW:   CBC   Recent Labs Lab 04/21/16 0645  WBC 10.6  HGB 12.0*  HCT 35.7*  PLT 203    Chemistries   Recent Labs Lab 04/18/16 0146 04/18/16 0603  04/20/16 0410  NA 139 138  < > 138  K 3.2* 3.0*  < > 3.9  CL 100* 102  < > 102  CO2 30 30  < > 28  GLUCOSE 135* 128*  < > 111*  BUN 6 6  < > 10  CREATININE 1.04 0.86  < > 0.92  CALCIUM 9.0 8.3*  < > 8.5*  MG 2.1  --   --   --   AST  --  38  --   --   ALT  --  15*  --   --   ALKPHOS  --  52  --   --   BILITOT  --  0.4  --   --   < > = values in this interval not displayed.  Microbiology Results   Recent Results (from the past  240 hour(s))  Urine culture     Status: Abnormal   Collection Time: 04/18/16  1:46 AM  Result Value Ref Range Status   Specimen Description URINE, RANDOM  Final   Special Requests NONE  Final   Culture MULTIPLE SPECIES PRESENT, SUGGEST RECOLLECTION (A)  Final   Report Status 04/19/2016 FINAL  Final  MRSA PCR Screening     Status: None   Collection Time: 04/18/16 12:22 PM  Result Value Ref Range Status   MRSA by PCR NEGATIVE NEGATIVE Final  Comment:        The GeneXpert MRSA Assay (FDA approved for NASAL specimens only), is one component of a comprehensive MRSA colonization surveillance program. It is not intended to diagnose MRSA infection nor to guide or monitor treatment for MRSA infections.     RADIOLOGY:  Dg Chest 1 View  Result Date: 04/21/2016 CLINICAL DATA:  Fall at home with right femoral neck fracture EXAM: CHEST 1 VIEW COMPARISON:  04/18/2016 FINDINGS: A left-sided pacemaker device with leads over the right atrium and right ventricle. No acute infiltrate or effusion. Stable enlarged cardiomediastinal none and mild prominence of the left hilum. No pneumothorax. Multiple old left-sided rib fractures. IMPRESSION: 1. No acute infiltrate or edema 2. Stable cardiomegaly Electronically Signed   By: Jasmine PangKim  Fujinaga M.D.   On: 04/21/2016 15:21     Management plans discussed with the patient, family and they are in agreement.  CODE STATUS:     Code Status Orders        Start     Ordered   04/18/16 0544  Full code  Continuous     04/18/16 0543    Code Status History    Date Active Date Inactive Code Status Order ID Comments User Context   04/18/2016  5:43 AM 04/18/2016  5:44 AM Full Code [id number]  Juanell FairlyKevin Krasinski, MD Inpatient      TOTAL TIME TAKING CARE OF THIS PATIENT: 40 minutes.    Yaniel Limbaugh M.D on 04/22/2016 at 9:33 AM  Between 7am to 6pm - Pager - [phone] After 6pm go to [email or web address] - password [email or web address] [facility]  [facility]  Office   [phone]  CC: Primary care physician; Barbette ReichmannHANDE,VISHWANATH, MD

## 2016-04-22 NOTE — Progress Notes (Signed)
Patient is medically stable for D/C to [facility] today. Per [facility] liasion patient will go to room 501. RN will call report to Elly ModenaKathy Rogers RN and arrange EMS for transport. Clinical Child psychotherapistocial Worker (CSW) sent [facility]/C orders to [employer] [employer] via [employer]. Patient's son Brett CanalesSteve was at bedside and aware of D/C today. CSW contacted patient's daughter Lucendia HerrlichFaye and made her aware of above. Please reconsult if future social work needs arise. CSW signing off.   Baker Hughes IncorporatedBailey Denzel Etienne, LCSW [phone]

## 2016-04-22 NOTE — Progress Notes (Signed)
EMS here to transport pt. 

## 2016-04-22 NOTE — Progress Notes (Signed)
  Subjective:  Patient discharged this AM.  Patient had been doing well from an orthopaedic standpoint and I agree with plan for discharge.  Objective:   VITALS:   Vitals:   04/22/16 0703 04/22/16 0913 04/22/16 0952 04/22/16 1050  BP:  (!) 159/48  (!) 143/66  Pulse: 72 (!) 41 77 66  Resp:  17  18  Temp:  97.7 F (36.5 C)  97.8 F (36.6 C)  TempSrc:  Oral  Oral  SpO2:  100%  99%  Weight:      Height:        PHYSICAL EXAM:  Patient already discharged before my exam.   LABS  Results for orders placed or performed during the hospital encounter of 04/18/16 (from the past 24 hour(s))  Urinalysis complete, with microscopic ([facility] only)     Status: Abnormal   Collection Time: 04/22/16  9:08 AM  Result Value Ref Range   Color, Urine YELLOW (A) YELLOW   APPearance CLEAR (A) CLEAR   Glucose, UA NEGATIVE NEGATIVE mg/dL   Bilirubin Urine NEGATIVE NEGATIVE   Ketones, ur 1+ (A) NEGATIVE mg/dL   Specific Gravity, Urine 1.011 1.005 - 1.030   Hgb urine dipstick NEGATIVE NEGATIVE   pH 8.0 5.0 - 8.0   Protein, ur 30 (A) NEGATIVE mg/dL   Nitrite NEGATIVE NEGATIVE   Leukocytes, UA NEGATIVE NEGATIVE   RBC / HPF 0-5 0 - 5 RBC/hpf   WBC, UA 0-5 0 - 5 WBC/hpf   Bacteria, UA NONE SEEN NONE SEEN   Squamous Epithelial / LPF 0-5 (A) NONE SEEN   Mucous PRESENT     Dg Chest 1 View  Result Date: 04/21/2016 CLINICAL DATA:  Fall at home with right femoral neck fracture EXAM: CHEST 1 VIEW COMPARISON:  04/18/2016 FINDINGS: A left-sided pacemaker device with leads over the right atrium and right ventricle. No acute infiltrate or effusion. Stable enlarged cardiomediastinal none and mild prominence of the left hilum. No pneumothorax. Multiple old left-sided rib fractures. IMPRESSION: 1. No acute infiltrate or edema 2. Stable cardiomegaly Electronically Signed   By: Jasmine PangKim  Fujinaga M.D.   On: 04/21/2016 15:21    Assessment/Plan: 4 Days Post-Op   Active Problems:   Elevated troponin  Patient will  follow up with me in 10-14 days.    Juanell FairlyKRASINSKI, KEVIN , MD 04/22/2016, 12:30 PM

## 2016-04-22 NOTE — Discharge Instructions (Signed)
PT

## 2016-05-15 ENCOUNTER — Encounter: Payer: Self-pay | Admitting: Emergency Medicine

## 2016-05-15 ENCOUNTER — Emergency Department: Payer: Medicare Other

## 2016-05-15 ENCOUNTER — Inpatient Hospital Stay
Admission: EM | Admit: 2016-05-15 | Discharge: 2016-05-18 | DRG: 682 | Disposition: A | Discharge status: Skilled Nursing Facility | Payer: Medicare Other | Attending: Internal Medicine | Admitting: Internal Medicine

## 2016-05-15 DIAGNOSIS — H9190 Unspecified hearing loss, unspecified ear: Secondary | ICD-10-CM | POA: Diagnosis present

## 2016-05-15 DIAGNOSIS — Z823 Family history of stroke: Secondary | ICD-10-CM | POA: Diagnosis not present

## 2016-05-15 DIAGNOSIS — Z7982 Long term (current) use of aspirin: Secondary | ICD-10-CM

## 2016-05-15 DIAGNOSIS — I480 Paroxysmal atrial fibrillation: Secondary | ICD-10-CM | POA: Diagnosis present

## 2016-05-15 DIAGNOSIS — D509 Iron deficiency anemia, unspecified: Secondary | ICD-10-CM | POA: Diagnosis present

## 2016-05-15 DIAGNOSIS — N390 Urinary tract infection, site not specified: Secondary | ICD-10-CM | POA: Diagnosis present

## 2016-05-15 DIAGNOSIS — Z8673 Personal history of transient ischemic attack (TIA), and cerebral infarction without residual deficits: Secondary | ICD-10-CM | POA: Diagnosis not present

## 2016-05-15 DIAGNOSIS — E784 Other hyperlipidemia: Secondary | ICD-10-CM | POA: Diagnosis present

## 2016-05-15 DIAGNOSIS — N179 Acute kidney failure, unspecified: Secondary | ICD-10-CM | POA: Diagnosis not present

## 2016-05-15 DIAGNOSIS — Z950 Presence of cardiac pacemaker: Secondary | ICD-10-CM | POA: Diagnosis not present

## 2016-05-15 DIAGNOSIS — I10 Essential (primary) hypertension: Secondary | ICD-10-CM | POA: Diagnosis present

## 2016-05-15 DIAGNOSIS — G9341 Metabolic encephalopathy: Secondary | ICD-10-CM | POA: Diagnosis present

## 2016-05-15 DIAGNOSIS — F0390 Unspecified dementia without behavioral disturbance: Secondary | ICD-10-CM | POA: Diagnosis present

## 2016-05-15 DIAGNOSIS — I447 Left bundle-branch block, unspecified: Secondary | ICD-10-CM | POA: Diagnosis present

## 2016-05-15 DIAGNOSIS — Z888 Allergy status to other drugs, medicaments and biological substances status: Secondary | ICD-10-CM | POA: Diagnosis not present

## 2016-05-15 DIAGNOSIS — E875 Hyperkalemia: Secondary | ICD-10-CM | POA: Diagnosis present

## 2016-05-15 DIAGNOSIS — E860 Dehydration: Secondary | ICD-10-CM | POA: Diagnosis present

## 2016-05-15 DIAGNOSIS — E785 Hyperlipidemia, unspecified: Secondary | ICD-10-CM | POA: Diagnosis present

## 2016-05-15 DIAGNOSIS — Z79899 Other long term (current) drug therapy: Secondary | ICD-10-CM

## 2016-05-15 DIAGNOSIS — Z87891 Personal history of nicotine dependence: Secondary | ICD-10-CM | POA: Diagnosis not present

## 2016-05-15 DIAGNOSIS — R4182 Altered mental status, unspecified: Secondary | ICD-10-CM

## 2016-05-15 DIAGNOSIS — R262 Difficulty in walking, not elsewhere classified: Secondary | ICD-10-CM

## 2016-05-15 DIAGNOSIS — M6281 Muscle weakness (generalized): Secondary | ICD-10-CM

## 2016-05-15 HISTORY — DX: Iron deficiency anemia, unspecified: D50.9

## 2016-05-15 HISTORY — DX: Dysphagia, unspecified: R13.10

## 2016-05-15 HISTORY — DX: Other hyperlipidemia: E78.49

## 2016-05-15 HISTORY — DX: Muscle weakness (generalized): M62.81

## 2016-05-15 LAB — URINALYSIS COMPLETE WITH MICROSCOPIC (ARMC ONLY)
BILIRUBIN URINE: NEGATIVE
Glucose, UA: NEGATIVE mg/dL
HGB URINE DIPSTICK: NEGATIVE
KETONES UR: NEGATIVE mg/dL
NITRITE: NEGATIVE
PH: 5 (ref 5.0–8.0)
PROTEIN: NEGATIVE mg/dL
SQUAMOUS EPITHELIAL / LPF: NONE SEEN
Specific Gravity, Urine: 1.015 (ref 1.005–1.030)

## 2016-05-15 LAB — CBC WITH DIFFERENTIAL/PLATELET
Basophils Absolute: 0.1 10*3/uL (ref 0–0.1)
Basophils Relative: 1 %
EOS PCT: 0 %
Eosinophils Absolute: 0 10*3/uL (ref 0–0.7)
HEMATOCRIT: 42.8 % (ref 40.0–52.0)
Hemoglobin: 14.6 g/dL (ref 13.0–18.0)
LYMPHS ABS: 2 10*3/uL (ref 1.0–3.6)
LYMPHS PCT: 14 %
MCH: 30.4 pg (ref 26.0–34.0)
MCHC: 34.2 g/dL (ref 32.0–36.0)
MCV: 88.9 fL (ref 80.0–100.0)
MONO ABS: 0.7 10*3/uL (ref 0.2–1.0)
Monocytes Relative: 5 %
NEUTROS ABS: 11.6 10*3/uL — AB (ref 1.4–6.5)
Neutrophils Relative %: 80 %
PLATELETS: 294 10*3/uL (ref 150–440)
RBC: 4.82 MIL/uL (ref 4.40–5.90)
RDW: 13.7 % (ref 11.5–14.5)
WBC: 14.4 10*3/uL — ABNORMAL HIGH (ref 3.8–10.6)

## 2016-05-15 LAB — COMPREHENSIVE METABOLIC PANEL
ALBUMIN: 3.7 g/dL (ref 3.5–5.0)
ALT: 17 U/L (ref 17–63)
ANION GAP: 10 (ref 5–15)
AST: 21 U/L (ref 15–41)
Alkaline Phosphatase: 104 U/L (ref 38–126)
BUN: 39 mg/dL — AB (ref 6–20)
CHLORIDE: 101 mmol/L (ref 101–111)
CO2: 19 mmol/L — AB (ref 22–32)
Calcium: 8.8 mg/dL — ABNORMAL LOW (ref 8.9–10.3)
Creatinine, Ser: 2.79 mg/dL — ABNORMAL HIGH (ref 0.61–1.24)
GFR calc Af Amer: 22 mL/min — ABNORMAL LOW (ref >60)
GFR calc non Af Amer: 19 mL/min — ABNORMAL LOW (ref >60)
GLUCOSE: 125 mg/dL — AB (ref 65–99)
POTASSIUM: 6 mmol/L — AB (ref 3.5–5.1)
SODIUM: 130 mmol/L — AB (ref 135–145)
Total Bilirubin: 1.2 mg/dL (ref 0.3–1.2)
Total Protein: 6.5 g/dL (ref 6.5–8.1)

## 2016-05-15 LAB — BLOOD GAS, VENOUS
Acid-base deficit: 2.2 mmol/L — ABNORMAL HIGH (ref 0.0–2.0)
Bicarbonate: 21.2 mmol/L (ref 20.0–28.0)
FIO2: 0.21
O2 SAT: 84.7 %
PATIENT TEMPERATURE: 37
PO2 VEN: 48 mmHg — AB (ref 32.0–45.0)
pCO2, Ven: 32 mmHg — ABNORMAL LOW (ref 44.0–60.0)
pH, Ven: 7.43 (ref 7.250–7.430)

## 2016-05-15 LAB — TROPONIN I: Troponin I: 0.04 ng/mL (ref <0.03)

## 2016-05-15 MED ORDER — SODIUM CHLORIDE 0.9 % IV BOLUS (SEPSIS)
1000.0000 mL | Freq: Once | INTRAVENOUS | Status: AC
  Administered 2016-05-15: 1000 mL via INTRAVENOUS

## 2016-05-15 MED ORDER — CEFTRIAXONE SODIUM-DEXTROSE 1-3.74 GM-% IV SOLR
1.0000 g | Freq: Once | INTRAVENOUS | Status: AC
  Administered 2016-05-15: 1 g via INTRAVENOUS
  Filled 2016-05-15: qty 50

## 2016-05-15 MED ORDER — SODIUM BICARBONATE 8.4 % IV SOLN
50.0000 meq | Freq: Once | INTRAVENOUS | Status: AC
  Administered 2016-05-15: 50 meq via INTRAVENOUS
  Filled 2016-05-15: qty 50

## 2016-05-15 MED ORDER — SODIUM POLYSTYRENE SULFONATE 15 GM/60ML PO SUSP
30.0000 g | Freq: Once | ORAL | Status: AC
  Administered 2016-05-15: 30 g via ORAL
  Filled 2016-05-15: qty 120

## 2016-05-15 MED ORDER — DEXTROSE 5 % IV SOLN: 1.0000 g | Freq: Once | INTRAVENOUS | Status: DC

## 2016-05-15 MED ORDER — CALCIUM GLUCONATE 10 % IV SOLN
1.0000 g | Freq: Once | INTRAVENOUS | Status: AC
  Administered 2016-05-15: 1 g via INTRAVENOUS
  Filled 2016-05-15: qty 10

## 2016-05-15 NOTE — ED Triage Notes (Signed)
Pt to ED via EMS from [facility] c/o chest pain, SOB, and possible aspiration.  States dx with UTI recently.  Pt with A.fib on monitor for EMS with no recorded hx of a.fib, patient does have pacemaker.  Pt presents alert, denies pain or SOB.

## 2016-05-15 NOTE — H&P (Signed)
[facility] [place] [facility]   PATIENT NAME: Nathaniel Fisher    MR#:  [id number]  DATE OF BIRTH:  [birth year]  DATE OF ADMISSION:  05/15/2016  PRIMARY CARE PHYSICIAN: Barbette ReichmannHANDE,VISHWANATH, MD   REQUESTING/REFERRING PHYSICIAN: Scotty CourtStafford, MD  CHIEF COMPLAINT:   Chief Complaint  Patient presents with  . Chest Pain  . Altered Mental Status  . Urinary Tract Infection  . Atrial Fibrillation    HISTORY OF PRESENT ILLNESS:  Nathaniel Fisher  is a 82 y.o. male who presents with Confusion, poor by mouth intake. Patient had hip fracture surgery several weeks ago and since then has been in a nursing facility for rehabilitation. There is some report that he had some possible aspiration event prior to being brought to the ED. However, he does not show signs of that here. He was also being treated for urinary tract infection. Here tonight he is found to have significant AK I with hyperkalemia. Hospitals were called for admission  PAST MEDICAL HISTORY:   Past Medical History:  Diagnosis Date  . Dysphagia   . Familial combined hyperlipidemia   . Hypertension   . Muscle weakness (generalized)   . Normocytic hypochromic anemia   . Stroke [facility](HCC)     PAST SURGICAL HISTORY:   Past Surgical History:  Procedure Laterality Date  . CHOLECYSTECTOMY    . ESOPHAGEAL DILATION     "9 or 10 years ago"  . HERNIA REPAIR    . INTRAMEDULLARY (IM) NAIL INTERTROCHANTERIC Right 04/18/2016   Procedure: INTRAMEDULLARY (IM) NAIL INTERTROCHANTRIC;  Surgeon: Juanell FairlyKevin Krasinski, MD;  Location: [facility] ORS;  Service: Orthopedics;  Laterality: Right;  . PACEMAKER INSERTION      SOCIAL HISTORY:   Social History  Substance Use Topics  . Smoking status: Former Games developermoker  . Smokeless tobacco: Current User    Types: Chew  . Alcohol use No    FAMILY HISTORY:   Family History  Problem Relation Age of Onset  . Stroke Mother   . Colon cancer Father   . Cancer Sister   . CAD Brother      DRUG ALLERGIES:  No Known Allergies  MEDICATIONS AT HOME:   Prior to Admission medications   Medication Sig Start Date End Date Taking? Authorizing Provider  ferrous sulfate 325 (65 FE) MG tablet Take 325 mg by mouth daily with breakfast.   Yes Historical Provider, MD  lisinopril (PRINIVIL,ZESTRIL) 10 MG tablet TAKE 1 TABLET BY MOUTH EVERY DAY   Yes Karie Schwalbeichard I Letvak, MD  lovastatin (MEVACOR) 20 MG tablet Take 20 mg by mouth at bedtime.   Yes Historical Provider, MD  metoprolol tartrate (LOPRESSOR) 25 MG tablet Take 1 tablet (25 mg total) by mouth 2 (two) times daily. 04/22/16  Yes Enedina FinnerSona Patel, MD  oxyCODONE (OXY IR/ROXICODONE) 5 MG immediate release tablet Take 1-2 tablets (5-10 mg total) by mouth every 6 (six) hours as needed for breakthrough pain ((for MODERATE breakthrough pain)). 04/22/16  Yes Enedina FinnerSona Patel, MD  potassium chloride SA (K-DUR,KLOR-CON) 20 MEQ tablet Take 20 mEq by mouth daily.   Yes Historical Provider, MD  sertraline (ZOLOFT) 25 MG tablet Take 25 mg by mouth daily.   Yes Historical Provider, MD  sulfamethoxazole-trimethoprim (BACTRIM DS,SEPTRA DS) 800-160 MG tablet Take 1 tablet by mouth 2 (two) times daily. 05/07/16 05/17/16 Yes Historical Provider, MD    REVIEW OF SYSTEMS:  Review of Systems  Unable to perform ROS: Acuity of condition     VITAL SIGNS:   Vitals:  05/15/16 2020 05/15/16 2027 05/15/16 2028 05/15/16 2030  BP: 133/87 133/87  140/83  Pulse: 84 70  70  Resp:  17  17  Temp:  97.7 F (36.5 C)    TempSrc:  Oral    SpO2: 97% 97%  98%  Weight:   71.6 kg (157 lb 14.4 oz)   Height:   5\' 7"  (1.702 m)    Wt Readings from Last 3 Encounters:  05/15/16 71.6 kg (157 lb 14.4 oz)  04/18/16 73.7 kg (162 lb 8 oz)  06/21/13 80 kg (176 lb 5.9 oz)    PHYSICAL EXAMINATION:  Physical Exam  Vitals reviewed. Constitutional: He appears well-developed and well-nourished. No distress.  HENT:  Head: Normocephalic and atraumatic.  Mouth/Throat: Oropharynx is clear  and moist.  Eyes: Conjunctivae and EOM are normal. Pupils are equal, round, and reactive to light. No scleral icterus.  Neck: Normal range of motion. Neck supple. No JVD present. No thyromegaly present.  Cardiovascular: Normal rate and intact distal pulses.  Exam reveals no gallop and no friction rub.   No murmur heard. Paced rhythm  Respiratory: Effort normal and breath sounds normal. No respiratory distress. He has no wheezes. He has no rales.  GI: Soft. Bowel sounds are normal. He exhibits no distension. There is no tenderness.  Musculoskeletal: Normal range of motion. He exhibits no edema.  No arthritis, no gout  Lymphadenopathy:    He has no cervical adenopathy.  Neurological: No cranial nerve deficit.  Unable to fully assess due to patient condition  Skin: Skin is warm and dry. No rash noted. No erythema.  Psychiatric:  Unable to assess due to patient condition    LABORATORY PANEL:   CBC  Recent Labs Lab 05/15/16 2032  WBC 14.4*  HGB 14.6  HCT 42.8  PLT 294   ------------------------------------------------------------------------------------------------------------------  Chemistries   Recent Labs Lab 05/15/16 2032  NA 130*  K 6.0*  CL 101  CO2 19*  GLUCOSE 125*  BUN 39*  CREATININE 2.79*  CALCIUM 8.8*  AST 21  ALT 17  ALKPHOS 104  BILITOT 1.2   ------------------------------------------------------------------------------------------------------------------  Cardiac Enzymes  Recent Labs Lab 05/15/16 2032  TROPONINI 0.04*   ------------------------------------------------------------------------------------------------------------------  RADIOLOGY:  Dg Chest Portable 1 View  Result Date: 05/15/2016 CLINICAL DATA:  Acute onset of generalized chest pain, shortness of breath and dyspnea. Initial encounter. EXAM: PORTABLE CHEST 1 VIEW COMPARISON:  Chest radiograph performed 04/21/2016 FINDINGS: A small left pleural effusion is noted. The lungs are  otherwise grossly clear. No pneumothorax is seen. The cardiomediastinal silhouette is borderline normal in size. A pacemaker is noted overlying the left chest wall, with leads ending overlying the right atrium and right ventricle. No acute osseous abnormalities are seen. IMPRESSION: Small left pleural effusion.  Lungs otherwise grossly clear. Electronically Signed   By: Roanna RaiderJeffery  Chang M.D.   On: 05/15/2016 21:15    EKG:   Orders placed or performed during the hospital encounter of 05/15/16  . EKG 12-Lead  . EKG 12-Lead    IMPRESSION AND PLAN:  Principal Problem:   AKI (acute kidney injury) (HCC) - patient was reportedly on Bactrim at some point for his UTI, though it is unlikely Bactrim would've cause such an elevation in his creatinine. His had poor by mouth intake in addition to his UTI. We are hydrating him gently with fluids tonight and will monitor for expected improvement, avoid nephrotoxins. Active Problems:   UTI (urinary tract infection) - antibiotics and culture   Hyperkalemia -  moderately elevated at 6, he received sodium bicarbonate, calcium, and Kayexalate in the ED. We will recheck his potassium tonight and redose Kayexalate if needed.   HTN (hypertension) - stable, continue home meds   HLD (hyperlipidemia) - continue home meds  All the records are reviewed and case discussed with ED provider. Management plans discussed with the patient and/or family.  DVT PROPHYLAXIS: SubQ heparin  GI PROPHYLAXIS: None  ADMISSION STATUS: Inpatient  CODE STATUS: Full Code Status History    Date Active Date Inactive Code Status Order ID Comments User Context   04/18/2016  5:44 AM 04/22/2016  4:05 PM Full Code [id number]  Arnaldo Natal, MD Inpatient   04/18/2016  5:43 AM 04/18/2016  5:44 AM Full Code [id number]  Juanell Fairly, MD Inpatient    Advance Directive Documentation   Flowsheet Row Most Recent Value  Type of Advance Directive  Healthcare Power of Attorney, Out of facility DNR  (pink MOST or yellow form)  Pre-existing out of facility DNR order (yellow form or pink MOST form)  No data  "MOST" Form in Place?  No data      TOTAL TIME TAKING CARE OF THIS PATIENT: 45 minutes.    Damian Buckles FIELDING 05/15/2016, 10:40 PM  [employer] [facility]  Office  [phone]  CC: Primary care physician; Barbette Reichmann, MD
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
## 2016-05-16 LAB — BASIC METABOLIC PANEL
ANION GAP: 5 (ref 5–15)
BUN: 30 mg/dL — ABNORMAL HIGH (ref 6–20)
CALCIUM: 8.6 mg/dL — AB (ref 8.9–10.3)
CO2: 24 mmol/L (ref 22–32)
Chloride: 109 mmol/L (ref 101–111)
Creatinine, Ser: 2.2 mg/dL — ABNORMAL HIGH (ref 0.61–1.24)
GFR, EST AFRICAN AMERICAN: 29 mL/min — AB (ref >60)
GFR, EST NON AFRICAN AMERICAN: 25 mL/min — AB (ref >60)
GLUCOSE: 98 mg/dL (ref 65–99)
POTASSIUM: 5 mmol/L (ref 3.5–5.1)
Sodium: 138 mmol/L (ref 135–145)

## 2016-05-16 LAB — CBC
HCT: 39.8 % — ABNORMAL LOW (ref 40.0–52.0)
HEMOGLOBIN: 13.6 g/dL (ref 13.0–18.0)
MCH: 30.8 pg (ref 26.0–34.0)
MCHC: 34.3 g/dL (ref 32.0–36.0)
MCV: 89.9 fL (ref 80.0–100.0)
Platelets: 237 10*3/uL (ref 150–440)
RBC: 4.43 MIL/uL (ref 4.40–5.90)
RDW: 13.8 % (ref 11.5–14.5)
WBC: 10.1 10*3/uL (ref 3.8–10.6)

## 2016-05-16 MED ORDER — ONDANSETRON HCL 4 MG PO TABS: 4.0000 mg | ORAL_TABLET | Freq: Four times a day (QID) | ORAL | Status: DC | PRN

## 2016-05-16 MED ORDER — HEPARIN SODIUM (PORCINE) 5000 UNIT/ML IJ SOLN
5000.0000 [IU] | Freq: Three times a day (TID) | INTRAMUSCULAR | Status: DC
  Administered 2016-05-16 – 2016-05-18 (×7): 5000 [IU] via SUBCUTANEOUS
  Filled 2016-05-16 (×6): qty 1

## 2016-05-16 MED ORDER — ACETAMINOPHEN 650 MG RE SUPP: 650.0000 mg | Freq: Four times a day (QID) | RECTAL | Status: DC | PRN

## 2016-05-16 MED ORDER — SODIUM CHLORIDE 0.9 % IV SOLN
INTRAVENOUS | Status: DC
  Administered 2016-05-16 – 2016-05-18 (×5): via INTRAVENOUS

## 2016-05-16 MED ORDER — ONDANSETRON HCL 4 MG/2ML IJ SOLN: 4.0000 mg | Freq: Four times a day (QID) | INTRAMUSCULAR | Status: DC | PRN

## 2016-05-16 MED ORDER — LISINOPRIL 10 MG PO TABS
10.0000 mg | ORAL_TABLET | Freq: Every day | ORAL | Status: DC
  Administered 2016-05-16 – 2016-05-18 (×3): 10 mg via ORAL
  Filled 2016-05-16 (×4): qty 1

## 2016-05-16 MED ORDER — ACETAMINOPHEN 325 MG PO TABS: 650.0000 mg | ORAL_TABLET | Freq: Four times a day (QID) | ORAL | Status: DC | PRN

## 2016-05-16 MED ORDER — SERTRALINE HCL 50 MG PO TABS
25.0000 mg | ORAL_TABLET | Freq: Every day | ORAL | Status: DC
  Administered 2016-05-16 – 2016-05-18 (×3): 25 mg via ORAL
  Filled 2016-05-16 (×3): qty 1

## 2016-05-16 MED ORDER — CEFTRIAXONE SODIUM-DEXTROSE 2-2.22 GM-% IV SOLR
2.0000 g | INTRAVENOUS | Status: DC
  Administered 2016-05-16: 2 g via INTRAVENOUS
  Filled 2016-05-16 (×2): qty 50

## 2016-05-16 MED ORDER — METOPROLOL TARTRATE 25 MG PO TABS
25.0000 mg | ORAL_TABLET | Freq: Two times a day (BID) | ORAL | Status: DC
  Administered 2016-05-16 – 2016-05-18 (×5): 25 mg via ORAL
  Filled 2016-05-16 (×6): qty 1

## 2016-05-16 MED ORDER — PRAVASTATIN SODIUM 20 MG PO TABS
20.0000 mg | ORAL_TABLET | Freq: Every day | ORAL | Status: DC
  Administered 2016-05-16 – 2016-05-17 (×2): 20 mg via ORAL
  Filled 2016-05-16 (×2): qty 1

## 2016-05-16 MED ORDER — SODIUM CHLORIDE 0.9 % IV SOLN
INTRAVENOUS | Status: DC
  Administered 2016-05-16: via INTRAVENOUS

## 2016-05-16 MED ORDER — SODIUM CHLORIDE 0.9% FLUSH
3.0000 mL | Freq: Two times a day (BID) | INTRAVENOUS | Status: DC
  Administered 2016-05-16 – 2016-05-17 (×5): 3 mL via INTRAVENOUS

## 2016-05-16 NOTE — Progress Notes (Signed)
[employer] - [place] [facility]                                                                                                                                                                                  Patient Demographics   Macoy Rodwell, is a 82 y.o. male, DOB - 12/20/1925, MVH:[id number]  Admit date - 05/15/2016   Admitting Physician Oralia Manis, MD  Outpatient Primary MD for the patient is Barbette Reichmann, MD   LOS - 1  Subjective:  Patient admitted with altered mental status chest pain and acute renal failure he is very hard of hearing he still states that he does not feel well.   Review of Systems:   CONSTITUTIONAL: No documented fever. Positive fatigue, and weakness. No weight gain, no weight loss.  EYES: No blurry or double vision.  ENT: No tinnitus. No postnasal drip. No redness of the oropharynx.  RESPIRATORY: No cough, no wheeze, no hemoptysis. No dyspnea.  CARDIOVASCULAR: No chest pain. No orthopnea. No palpitations. No syncope.  GASTROINTESTINAL: No nausea, no vomiting or diarrhea. No abdominal pain. No melena or hematochezia.  GENITOURINARY: No dysuria or hematuria.  ENDOCRINE: No polyuria or nocturia. No heat or cold intolerance.  HEMATOLOGY: No anemia. No bruising. No bleeding.  INTEGUMENTARY: No rashes. No lesions.  MUSCULOSKELETAL: No arthritis. No swelling. No gout.  NEUROLOGIC: No numbness, tingling, or ataxia. No seizure-type activity.  PSYCHIATRIC: No anxiety. No insomnia. No ADD.    Vitals:   Vitals:   05/16/16 0012 05/16/16 0412 05/16/16 0712 05/16/16 1147  BP: (!) 164/65 (!) 157/69 134/67 112/64  Pulse: 73 71 86 80  Resp: 20 14 20 18   Temp: 97.6 F (36.4 C) 97.7 F (36.5 C) 97.6 F (36.4 C) 97.7 F (36.5 C)  TempSrc: Oral Oral Oral Oral  SpO2: 98% 96% 97% 97%  Weight:      Height:        Wt Readings from Last 3 Encounters:  05/15/16 157 lb 14.4 oz (71.6 kg)  04/18/16 162 lb 8 oz (73.7 kg)  06/21/13 176 lb  5.9 oz (80 kg)    No intake or output data in the 24 hours ending 05/16/16 1245  Physical Exam:   GENERAL: No acute distress HEAD, EYES, EARS, NOSE AND THROAT: Atraumatic, normocephalic. Extraocular muscles are intact. Pupils equal and reactive to light. Sclerae anicteric. No conjunctival injection. No oro-pharyngeal erythema.  NECK: Supple. There is no jugular venous distention. No bruits, no lymphadenopathy, no thyromegaly.  HEART: Regular rate and rhythm,. No murmurs, no rubs, no clicks.  LUNGS: Clear to auscultation bilaterally. No rales or rhonchi. No wheezes.  ABDOMEN: Soft, flat, nontender, nondistended. Has good  bowel sounds. No hepatosplenomegaly appreciated.  EXTREMITIES: No evidence of any cyanosis, clubbing, or peripheral edema.  +2 pedal and radial pulses bilaterally.  NEUROLOGIC: The patient is alert, awake, and oriented x3 with no focal motor or sensory deficits appreciated bilaterally.  SKIN: Moist and warm with no rashes appreciated.  Psych: Not anxious, depressed LN: No inguinal LN enlargement    Antibiotics   Anti-infectives    Start     Dose/Rate Route Frequency Ordered Stop   05/16/16 1800  cefTRIAXone (ROCEPHIN) IVPB 2 g     2 g 100 mL/hr over 30 Minutes Intravenous Every 24 hours 05/16/16 0018     05/15/16 2230  cefTRIAXone (ROCEPHIN) 1 g in dextrose 5 % 50 mL IVPB  Status:  Discontinued     1 g 100 mL/hr over 30 Minutes Intravenous  Once 05/15/16 2221 05/15/16 2223   05/15/16 2230  cefTRIAXone (ROCEPHIN) IVPB 1 g     1 g 100 mL/hr over 30 Minutes Intravenous  Once 05/15/16 2224 05/15/16 2329      Medications   Scheduled Meds: . cefTRIAXone  2 g Intravenous Q24H  . heparin  5,000 Units Subcutaneous Q8H  . lisinopril  10 mg Oral Daily  . metoprolol tartrate  25 mg Oral BID  . pravastatin  20 mg Oral q1800  . sertraline  25 mg Oral Daily  . sodium chloride flush  3 mL Intravenous Q12H   Continuous Infusions: PRN Meds:.acetaminophen **OR**  acetaminophen, ondansetron **OR** ondansetron (ZOFRAN) IV   Data Review:   Micro Results No results found for this or any previous visit (from the past 240 hour(s)).  Radiology Reports Dg Chest 1 View  Result Date: 04/21/2016 CLINICAL DATA:  Fall at home with right femoral neck fracture EXAM: CHEST 1 VIEW COMPARISON:  04/18/2016 FINDINGS: A left-sided pacemaker device with leads over the right atrium and right ventricle. No acute infiltrate or effusion. Stable enlarged cardiomediastinal none and mild prominence of the left hilum. No pneumothorax. Multiple old left-sided rib fractures. IMPRESSION: 1. No acute infiltrate or edema 2. Stable cardiomegaly Electronically Signed   By: Jasmine PangKim  Fujinaga M.D.   On: 04/21/2016 15:21   Dg Chest 1 View  Result Date: 04/18/2016 CLINICAL DATA:  Hip fracture.  [employer] MicrosystemsFell tonight. EXAM: CHEST 1 VIEW COMPARISON:  10/01/2014 FINDINGS: There are intact appearances of the transvenous cardiac leads. The lungs are clear. There is no pneumothorax or large effusion. IMPRESSION: No acute cardiopulmonary findings. Electronically Signed   By: Ellery Plunkaniel R Mitchell M.D.   On: 04/18/2016 02:59   Ct Head Wo Contrast  Result Date: 04/25/2016 CLINICAL DATA:  Larey SeatFell tonight EXAM: CT brain without contrast CT cervical spine TECHNIQUE: Axial images of the head were obtained from skullbase to vertex without the use of intravenous contrast. Axial images of the cervical spine were obtained, with sagittal and coronal reconstructions. COMPARISON:  None FINDINGS: CT brain: There is no intracranial hemorrhage or extra-axial fluid collection. There is moderate generalized atrophy. There is hemispheric white matter hypodensity which likely represents chronic small vessel ischemic disease. Skull base and calvarium are intact. Orbits and paranasal sinuses are unremarkable. CT cervical spine: The cervical vertebrae are normal in height. The vertebral column, pedicles and facet articulations are intact. No  evidence of acute cervical spine fracture. Moderate degenerative disc and facet changes are present. IMPRESSION: 1. Negative for acute intracranial traumatic injury. There is moderate generalized atrophy and small vessel ischemic disease. 2. Negative for acute cervical spine fracture. Electronically Signed   By:  Ellery Plunk M.D.   On: 04/18/2016 02:25   Dg Chest Portable 1 View  Result Date: 05/15/2016 CLINICAL DATA:  Acute onset of generalized chest pain, shortness of breath and dyspnea. Initial encounter. EXAM: PORTABLE CHEST 1 VIEW COMPARISON:  Chest radiograph performed 04/21/2016 FINDINGS: A small left pleural effusion is noted. The lungs are otherwise grossly clear. No pneumothorax is seen. The cardiomediastinal silhouette is borderline normal in size. A pacemaker is noted overlying the left chest wall, with leads ending overlying the right atrium and right ventricle. No acute osseous abnormalities are seen. IMPRESSION: Small left pleural effusion.  Lungs otherwise grossly clear. Electronically Signed   By: Roanna Raider M.D.   On: 05/15/2016 21:15   Dg Hip Port Unilat With Pelvis 1v Right  Result Date: 04/18/2016 CLINICAL DATA:  Postop right hip fracture fixation. EXAM: DG HIP (WITH OR WITHOUT PELVIS) 1V PORT RIGHT COMPARISON:  04/18/2016 FINDINGS: There is a intra medullary gamma nail with a proximal compression screw with hip and a distal interlocking screw. Good position and alignment without complicating features. IMPRESSION: Internal fixation hardware in good position without complicating features. Electronically Signed   By: Rudie Meyer M.D.   On: 04/18/2016 18:58   Dg Hip Operative Unilat W Or W/o Pelvis Right  Result Date: 04/18/2016 CLINICAL DATA:  ORIF of right hip fracture EXAM: OPERATIVE RIGHTright HIP (WITH PELVIS IF PERFORMED) 5 VIEWS TECHNIQUE: Fluoroscopic spot image(s) were submitted for interpretation post-operatively. COMPARISON:  04/18/2016 FINDINGS: C-arm fluoroscopic  views demonstrate an intra medullary nail fixation across a be easy cervical fracture of the right femur. Fine bony detail is limited. Alignment appears near anatomic. No hardware failure is seen. IMPRESSION: New intramedullary nail fixation of right femoral neck fracture. Alignment appears near-anatomic. Electronically Signed   By: Tollie Eth M.D.   On: 04/18/2016 19:37   Dg Hip Unilat With Pelvis 2-3 Views Right  Result Date: 04/18/2016 CLINICAL DATA:  Larey Seat EXAM: DG HIP (WITH OR WITHOUT PELVIS) 2-3V RIGHT COMPARISON:  None. FINDINGS: There is a transcervical right hip fracture. No dislocation. Remote healed fracture deformities of the right pubic rami. IMPRESSION: Right hip fracture Electronically Signed   By: Ellery Plunk M.D.   On: 04/18/2016 02:58     CBC  Recent Labs Lab 05/15/16 2032 05/16/16 0457  WBC 14.4* 10.1  HGB 14.6 13.6  HCT 42.8 39.8*  PLT 294 237  MCV 88.9 89.9  MCH 30.4 30.8  MCHC 34.2 34.3  RDW 13.7 13.8  LYMPHSABS 2.0  --   MONOABS 0.7  --   EOSABS 0.0  --   BASOSABS 0.1  --     Chemistries   Recent Labs Lab 05/15/16 2032 05/16/16 0457  NA 130* 138  K 6.0* 5.0  CL 101 109  CO2 19* 24  GLUCOSE 125* 98  BUN 39* 30*  CREATININE 2.79* 2.20*  CALCIUM 8.8* 8.6*  AST 21  --   ALT 17  --   ALKPHOS 104  --   BILITOT 1.2  --    ------------------------------------------------------------------------------------------------------------------ estimated creatinine clearance is 21.7 mL/min (by C-G formula based on SCr of 2.2 mg/dL (H)). ------------------------------------------------------------------------------------------------------------------ No results for input(s): HGBA1C in the last 72 hours. ------------------------------------------------------------------------------------------------------------------ No results for input(s): CHOL, HDL, LDLCALC, TRIG, CHOLHDL, LDLDIRECT in the last 72  hours. ------------------------------------------------------------------------------------------------------------------ No results for input(s): TSH, T4TOTAL, T3FREE, THYROIDAB in the last 72 hours.  Invalid input(s): FREET3 ------------------------------------------------------------------------------------------------------------------ No results for input(s): VITAMINB12, FOLATE, FERRITIN, TIBC, IRON, RETICCTPCT in the last 72  hours.  Coagulation profile No results for input(s): INR, PROTIME in the last 168 hours.  No results for input(s): DDIMER in the last 72 hours.  Cardiac Enzymes  Recent Labs Lab 05/15/16 2032  TROPONINI 0.04*   ------------------------------------------------------------------------------------------------------------------ Invalid input(s): POCBNP    Assessment & Plan  Patient is a 82 year old  1,  AKI (acute kidney injury) (HCC) - due to dehydration Bactrim therapy renal function with some improvement 2. Hyperkalemia also resolved likely due to Bactrim therapy follow BMP in the morning 3.  UTI (urinary tract infection) -continue ceftriaxone and follow urine cultures 4. Acute encephalopathy likely due to combination of acute kidney injury UTI now symptoms improve   5. Essential   HTN (hypertension) - stable, continue home meds blood pressure currently stable 6.  HLD (hyperlipidemia) - continue home meds      Code Status Orders        Start     Ordered   05/16/16 0013  Full code  Continuous     05/16/16 0012    Code Status History    Date Active Date Inactive Code Status Order ID Comments User Context   04/18/2016  5:44 AM 04/22/2016  4:05 PM Full Code [id number]  Arnaldo NatalMichael S Diamond, MD Inpatient   04/18/2016  5:43 AM 04/18/2016  5:44 AM Full Code [id number]  Juanell FairlyKevin Krasinski, MD Inpatient    Advance Directive Documentation   Flowsheet Row Most Recent Value  Type of Advance Directive  Healthcare Power of Attorney  Pre-existing out of facility  DNR order (yellow form or pink MOST form)  No data  "MOST" Form in Place?  No data           Consults  none   DVT Prophylaxis  Lovenox   Lab Results  Component Value Date   PLT 237 05/16/2016     Time Spent in minutes   35min Greater than 50% of time spent in care coordination and counseling patient regarding the condition and plan of care.   Auburn BilberryPATEL, Jasiah Elsen M.D on 05/16/2016 at 12:45 PM  Between 7am to 6pm - Pager - [phone]  After 6pm go to [email or web address] - password [email or web address]  [facility]   Office  [phone]

## 2016-05-16 NOTE — Progress Notes (Signed)
Pharmacy Antibiotic Note  Nathaniel SabalWillard P Fisher is a 82 y.o. male admitted on 05/15/2016 with UTI.  Pharmacy has been consulted for ceftriaxone dosing.  Plan: Ceftriaxone 2 grams q 24 hours ordered.  Height: 5\' 7"  (170.2 cm) Weight: 157 lb 14.4 oz (71.6 kg) IBW/kg (Calculated) : 66.1  Temp (24hrs), Avg:97.7 F (36.5 C), Min:97.6 F (36.4 C), Max:97.7 F (36.5 C)   Recent Labs Lab 05/15/16 2032  WBC 14.4*  CREATININE 2.79*    Estimated Creatinine Clearance: 17.1 mL/min (by C-G formula based on SCr of 2.79 mg/dL (H)).    No Known Allergies  Antimicrobials this admission: ceftriaxone 12/2 >>    >>   Dose adjustments this admission:   Microbiology results: 12/2 UCx: pending  11/5 MRSA PCR: (-)    12/2 UA: LE(+) NO2(-) WBC 6-30  Thank you for allowing pharmacy to be a part of this patient's care.  Sahvanna Mcmanigal S 05/16/2016 12:20 AM

## 2016-05-17 LAB — CBC
HCT: 38.5 % — ABNORMAL LOW (ref 40.0–52.0)
Hemoglobin: 13.1 g/dL (ref 13.0–18.0)
MCH: 30.5 pg (ref 26.0–34.0)
MCHC: 33.9 g/dL (ref 32.0–36.0)
MCV: 90 fL (ref 80.0–100.0)
PLATELETS: 219 10*3/uL (ref 150–440)
RBC: 4.28 MIL/uL — AB (ref 4.40–5.90)
RDW: 14.3 % (ref 11.5–14.5)
WBC: 7.7 10*3/uL (ref 3.8–10.6)

## 2016-05-17 LAB — BASIC METABOLIC PANEL
Anion gap: 6 (ref 5–15)
BUN: 20 mg/dL (ref 6–20)
CALCIUM: 8.4 mg/dL — AB (ref 8.9–10.3)
CHLORIDE: 108 mmol/L (ref 101–111)
CO2: 24 mmol/L (ref 22–32)
CREATININE: 1.5 mg/dL — AB (ref 0.61–1.24)
GFR, EST AFRICAN AMERICAN: 46 mL/min — AB (ref >60)
GFR, EST NON AFRICAN AMERICAN: 40 mL/min — AB (ref >60)
Glucose, Bld: 88 mg/dL (ref 65–99)
Potassium: 4.1 mmol/L (ref 3.5–5.1)
SODIUM: 138 mmol/L (ref 135–145)

## 2016-05-17 LAB — URINE CULTURE

## 2016-05-17 MED ORDER — CEFUROXIME AXETIL 500 MG PO TABS
500.0000 mg | ORAL_TABLET | Freq: Two times a day (BID) | ORAL | Status: DC
  Administered 2016-05-17 – 2016-05-18 (×2): 500 mg via ORAL
  Filled 2016-05-17 (×2): qty 1

## 2016-05-17 NOTE — Evaluation (Signed)
Clinical/Bedside Swallow Evaluation Patient Details  Name: Nathaniel Fisher MRN: [id number] Date of Birth: Apr 23, 1926  Today's Date: 05/17/2016 Time: SLP Start Time (ACUTE ONLY): 1230 SLP Stop Time (ACUTE ONLY): 1330 SLP Time Calculation (min) (ACUTE ONLY): 60 min  Past Medical History:  Past Medical History:  Diagnosis Date  . Dysphagia   . Familial combined hyperlipidemia   . Hypertension   . Muscle weakness (generalized)   . Normocytic hypochromic anemia   . Stroke [facility](HCC)    Past Surgical History:  Past Surgical History:  Procedure Laterality Date  . CHOLECYSTECTOMY    . ESOPHAGEAL DILATION     "9 or 10 years ago"  . HERNIA REPAIR    . INTRAMEDULLARY (IM) NAIL INTERTROCHANTERIC Right 04/18/2016   Procedure: INTRAMEDULLARY (IM) NAIL INTERTROCHANTRIC;  Surgeon: Juanell FairlyKevin Krasinski, MD;  Location: [facility] ORS;  Service: Orthopedics;  Laterality: Right;  . PACEMAKER INSERTION     HPI:  Pt  is a 82 y.o. male who presents with Confusion, poor by mouth intake. Patient had hip fracture surgery several weeks ago and since then has been in a nursing facility for rehabilitation. Pt has a h/o old CVA and HTN; Esophageal phase dysphagia(2013). Possible report of an aspiration event prior to admission, however, he does not show signs of that here. He was also being treated for urinary tract infection. Here tonight, he is found to have significant AKI with hyperkalemia. Of note, pt has a h/o Esophageal Dysphagia w/ possible dilitation -  irregular narrowing of the distal esophagus measuring ~4cm in length. Unsure of the treatment of this then. Pt denied any significant difficulty swallowing - he did endorse having to "throw up the food sometimes". He denied being seen by GI recently. Pt also chew tobacco and holds bolus material orally for extended periods of time.    Assessment / Plan / Recommendation Clinical Impression  Pt appears at reduced risk for aspiration following general aspiration  precautions including sitting fully upright and taking small sips/bites, slowly - NO Straw use at this time; Esophageal phase dysmotility baseline. Pt exhibited no significant oropharyngeal phase deficits during the trials given at this evaluation - no coughing or change in vocal quality. Oral phase required min extra time for gumming/mastication d/t edentulous status. Pt helped to feed self w/ min setup assistance given d/t overall weakness. Education given on aspiration precautions and monitoring of any Esophageal dysmotility/dysphagia during meals/oral intake and to slow down and/or take rest breaks when necessary to avoid any retrograde bolus activity(as pt stated has happened - this could increase risk for aspiration of Reflux material thus impacting Pulmonary status). Esophageal dysmotility can also impact oral phase of swallowing in some patients. Pt appears at his baseline w/ swallowing w/ no further skilled ST services indicated at this time; NSG to reconsult if any changes in status while admitted. Pt agreed.     Aspiration Risk   (reduced following general aspiration and Reflux precs.)    Diet Recommendation  Mech Soft diet w/ chopped meats d/t edentulous status; thin liquids - NO Straws. General aspiration precautions; REFLUX precautions.   Medication Administration: Whole meds with liquid (or in Puree for easier swallowing if needed)    Other  Recommendations Recommended Consults: Consider GI evaluation;Consider esophageal assessment (Dietician consult) Oral Care Recommendations: Oral care BID;Staff/trained caregiver to provide oral care   Follow up Recommendations None for ST; GI consult as indicated.     Frequency and Duration  Prognosis Prognosis for Safe Diet Advancement: Good Barriers to Reach Goals:  (Esophageal phase dysmotility baseline)      Swallow Study   General Date of Onset: 05/15/16 HPI: Pt  is a 82 y.o. male who presents with Confusion, poor by mouth  intake. Patient had hip fracture surgery several weeks ago and since then has been in a nursing facility for rehabilitation. Pt has a h/o old CVA and HTN; Esophageal phase dysphagia(2013). Possible report of an aspiration event prior to admission, however, he does not show signs of that here. He was also being treated for urinary tract infection. Here tonight, he is found to have significant AKI with hyperkalemia. Of note, pt has a h/o Esophageal Dysphagia w/ possible dilitation -  irregular narrowing of the distal esophagus measuring ~4cm in length. Unsure of the treatment of this then. Pt denied any significant difficulty swallowing - he did endorse having to "throw up the food sometimes". He denied being seen by GI recently. Pt also chew tobacco and holds bolus material orally for extended periods of time.  Type of Study: Bedside Swallow Evaluation Previous Swallow Assessment: none indicated Diet Prior to this Study: Dysphagia 3 (soft);Thin liquids (soft foods per pt report) Temperature Spikes Noted: No (wbc 7.7) Respiratory Status: Room air History of Recent Intubation: No Behavior/Cognition: Alert;Cooperative;Pleasant mood (HOH) Oral Cavity Assessment: Within Functional Limits Oral Care Completed by SLP: Recent completion by staff Oral Cavity - Dentition: Edentulous (does not like to wear the upper denture plate he has) Vision: Functional for self-feeding Self-Feeding Abilities: Able to feed self;Needs set up Patient Positioning: Upright in chair Baseline Vocal Quality: Normal Volitional Cough: Strong Volitional Swallow: Able to elicit    Oral/Motor/Sensory Function Overall Oral Motor/Sensory Function: Within functional limits   Ice Chips Ice chips: Not tested   Thin Liquid Thin Liquid: Within functional limits Presentation: Cup;Self Fed Other Comments: straws not tested or recommended at this time    Nectar Thick Nectar Thick Liquid: Not tested   Honey Thick Honey Thick Liquid: Not  tested   Puree Puree: Within functional limits Presentation: Self Fed;Spoon (4 ozs)   Solid   GO   Solid: Impaired Presentation: Self Fed;Spoon (2 trials) Oral Phase Impairments: Impaired mastication (edentulous) Oral Phase Functional Implications: Impaired mastication (min) Pharyngeal Phase Impairments:  (none) Other Comments: well-broken down solids; time needed for gumming and clearing orally         Jerilynn SomKatherine Nashira Mcglynn, MS, CCC-SLP Jayni Prescher 05/17/2016,2:54 PM

## 2016-05-17 NOTE — Clinical Social Work Placement (Signed)
   CLINICAL SOCIAL WORK PLACEMENT  NOTE  Date:  05/17/2016  Patient Details  Name: Nathaniel Fisher MRN: [id number] Date of Birth: 11/25/1925  Clinical Social Work is seeking post-discharge placement for this patient at the Skilled  Nursing Facility level of care (*CSW will initial, date and re-position this form in  chart as items are completed):  Yes   Patient/family provided with [place] Clinical Social Work Department's list of facilities offering this level of care within the geographic area requested by the patient (or if unable, by the patient's family).  Yes   Patient/family informed of their freedom to choose among providers that offer the needed level of care, that participate in Medicare, Medicaid or managed care program needed by the patient, have an available bed and are willing to accept the patient.  Yes   Patient/family informed of [place]'s ownership interest in [facility] and [facility], as well as of the fact that they are under no obligation to receive care at these facilities.  PASRR submitted to EDS on       PASRR number received on       Existing PASRR number confirmed on 05/17/16     FL2 transmitted to all facilities in geographic area requested by pt/family on 05/17/16     FL2 transmitted to all facilities within larger geographic area on       Patient informed that his/her managed care company has contracts with or will negotiate with certain facilities, including the following:        Yes   Patient/family informed of bed offers received.  Patient chooses bed at  [facility]     Physician recommends and patient chooses bed at      Patient to be transferred to   on  .  Patient to be transferred to facility by       Patient family notified on   of transfer.  Name of family member notified:        PHYSICIAN       Additional Comment:    _______________________________________________ Taiden Raybourn, Darleen CrockerBailey M, LCSW 05/17/2016, 3:28  PM

## 2016-05-17 NOTE — Clinical Social Work Note (Signed)
Clinical Social Work Assessment  Patient Details  Name: Nathaniel Fisher MRN: [id number] Date of Birth: 29-Jul-1925  Date of referral:  05/17/16               Reason for consult:  Facility Placement                Permission sought to share information with:  Chartered certified accountant granted to share information::  Yes, Verbal Permission Granted  Name::      Nathaniel Fisher::   Nathaniel Fisher   Relationship::     Contact Information:     Housing/Transportation Living arrangements for the past 2 months:  [place], [place] of Information:  Adult Children Patient Interpreter Needed:  None Criminal Activity/Legal Involvement Pertinent to Current Situation/Hospitalization:  No - Comment as needed Significant Relationships:  Adult Children Lives with:  Self, Facility Resident Do you feel safe going back to the place where you live?  Yes Need for family participation in patient care:  Yes (Comment)  Care giving concerns:  Patient came into [facility] from [facility] where he was at for short term rehab from 04/22/16 to 05/15/16.    Social Worker assessment / plan:  Holiday representative ([place]) received verbal consult from RN in progression rounds that patient is from [facility] and the family wants him to go to another facility. CSW met with patient and his daughter Nathaniel Fisher [phone] was at bedside. CSW introduced self and explained role of CSW department. Patient was pleasantly confused and did not participate in assessment. Per daughter Nathaniel Fisher they had a bad experience at [facility] and would like to change facilities. FL2 was complete and faxed out.   CSW presented bed offers to daughter. She and her brother discussed offers and chose [facility]. [place] admissions coordinator at [facility] is aware of accepted bed offer. Per MD patient likely D/C tomorrow. CSW will continue to follow and assist as needed.   Employment status:  Disabled (Comment on  whether or not currently receiving Disability), Retired Forensic scientist:  Medicare PT Recommendations:  [place] / Referral to community resources:  [place]  Patient/Family's Response to care:  Patient's family would like for patient to go to [employer].   Patient/Family's Understanding of and Emotional Response to Diagnosis, Current Treatment, and Prognosis: Patient's daughter Nathaniel Fisher was very pleasant and thanked CSW for assistance.   Emotional Assessment Appearance:  Appears stated age Attitude/Demeanor/Rapport:    Affect (typically observed):  Accepting, Adaptable, Pleasant Orientation:  Oriented to Self, Fluctuating Orientation (Suspected and/or reported Sundowners), Oriented to Place Alcohol / Substance use:  Not Applicable Psych involvement (Current and /or in the community):  No (Comment)  Discharge Needs  Concerns to be addressed:  Discharge Planning Concerns Readmission within the last 30 days:  No Current discharge risk:  Dependent with Mobility Barriers to Discharge:  Continued Medical Work up   [employer], Veronia Beets, LCSW 05/17/2016, 3:29 PM

## 2016-05-17 NOTE — Care Management (Signed)
Presents from [facility]. It was presented from nursing staff that patients family would like alternative placement. CSW aware.

## 2016-05-17 NOTE — Evaluation (Signed)
Physical Therapy Evaluation Patient Details Name: Nathaniel Fisher MRN: [id number] DOB: [birth year] Today's Date: 05/17/2016   History of Present Illness  Pt is a [age] y/o M admitted from SNF with AMS, chest pain, acute renal failure.  Of note, recent s/p fall at home and R femoral neck fx, s/p IM nail R hip.  D/c to SNF following.  Pt's PMH includes stroke, hernia repair, permanent pacemaker insertion.    Clinical Impression  Pt admitted with above diagnosis. Pt currently with functional limitations due to the deficits listed below (see PT Problem List). Nathaniel Fisher presents generalized weakness and is quick to fatigue.  He expresses desire to get OOB and to chair.  He requires mod +2 assist to stand and to ambulate 6 ft in room using RW.  Pt will benefit from skilled PT to increase their independence and safety with mobility to allow discharge to the venue listed below.      Follow Up Recommendations SNF    Equipment Recommendations  Other (comment) (TBD at next venue of care)    Recommendations for Other Services OT consult     Precautions / Restrictions Precautions Precautions: Fall Restrictions Weight Bearing Restrictions: No      Mobility  Bed Mobility Overal bed mobility: Needs Assistance Bed Mobility: Supine to Sit     Supine to sit: Mod assist     General bed mobility comments: Mod assist to elevate trunk and pt uses bed rail to pull to sitting  Transfers Overall transfer level: Needs assistance Equipment used: Rolling walker (2 wheeled) Transfers: Sit to/from [employer]Stand;Stand Pivot Transfers Sit to Stand: Mod assist;+2 physical assistance Stand pivot transfers: Mod assist;+2 physical assistance       General transfer comment: Assist to boost to standing and to steady.  Pt demonstrates flexed posture and requires assist to maneuver RW to turn.  Ambulation/Gait Ambulation/Gait assistance: Mod assist;+2 physical assistance Ambulation Distance (Feet): 6  Feet Assistive device: Rolling walker (2 wheeled) Gait Pattern/deviations: Step-through pattern;Antalgic;Trunk flexed Gait velocity: decreased Gait velocity interpretation: <1.8 ft/sec, indicative of risk for recurrent falls General Gait Details: Flexed posture which pt does not correct with verbal cues.  Assist managing RW and close chair follow behind pt.    Stairs            Wheelchair Mobility    Modified Rankin (Stroke Patients Only)       Balance Overall balance assessment: Needs assistance;History of Falls Sitting-balance support: No upper extremity supported;Feet supported Sitting balance-Leahy Scale: Poor Sitting balance - Comments: Pt unsteady sitting EOB, requiring occasional minA  Postural control: Posterior lean Standing balance support: Bilateral upper extremity supported;During functional activity Standing balance-Leahy Scale: Poor Standing balance comment: Relies on RW and outside physical assist to steady                             Pertinent Vitals/Pain Pain Assessment: Faces Faces Pain Scale: Hurts little more Pain Location: "It's everywhere, my stomach, everywhere" Pain Descriptors / Indicators: Discomfort Pain Intervention(s): Limited activity within patient's tolerance;Monitored during session;Repositioned    Home Living Family/patient expects to be discharged to:: Skilled nursing facility                 Additional Comments: From SNF following recent hospital stay.    Prior Function Level of Independence: Needs assistance   Gait / Transfers Assistance Needed: Pt required up to total assist for OOB activity using RW  ADL's /  Homemaking Assistance Needed: Needs assist for ADLs  Comments: No family present during evaluation so information gathered from most recent hospital stay.  Per RN pt [facility]     Hand Dominance   Dominant Hand: Right    Extremity/Trunk Assessment   Upper Extremity  Assessment: Generalized weakness           Lower Extremity Assessment: Generalized weakness      Cervical / Trunk Assessment: Kyphotic  Communication   Communication: HOH  Cognition Arousal/Alertness: Awake/alert Behavior During Therapy: WFL for tasks assessed/performed Overall Cognitive Status: History of cognitive impairments - at baseline                      General Comments      Exercises General Exercises - Lower Extremity Long Arc Quad: AROM;Both;10 reps;Seated Hip Flexion/Marching: Both;10 reps;Seated;Strengthening   Assessment/Plan    PT Assessment Patient needs continued PT services  PT Problem List Decreased strength;Decreased activity tolerance;Decreased balance;Decreased mobility;Decreased cognition;Decreased knowledge of use of DME;Decreased safety awareness;Pain          PT Treatment Interventions DME instruction;Gait training;Functional mobility training;Therapeutic activities;Therapeutic exercise;Balance training;Neuromuscular re-education;Patient/family education;Cognitive remediation    PT Goals (Current goals can be found in the Care Plan section)  Acute Rehab PT Goals Patient Stated Goal: to get OOB PT Goal Formulation: With patient Time For Goal Achievement: 05/31/16 Potential to Achieve Goals: Fair    Frequency Min 2X/week   Barriers to discharge        Co-evaluation               End of Session Equipment Utilized During Treatment: Gait belt Activity Tolerance: Patient limited by fatigue Patient left: in chair;with call bell/phone within reach;with chair alarm set Nurse Communication: Mobility status         Time: [id number] PT Time Calculation (min) (ACUTE ONLY): 19 min   Charges:   PT Evaluation $PT Eval Low Complexity: 1 Procedure     PT G CodesEncarnacion Fisher:        Nathaniel Fisher PT, DPT 05/17/2016, 1:04 PM

## 2016-05-17 NOTE — Progress Notes (Signed)
[employer] - [place] [facility]                                                                                                                                                                                  Patient Demographics   Nathaniel Fisher, is a 82 y.o. male, DOB - 09-26-1925, QIO:[id number]  Admit date - 05/15/2016   Admitting Physician Oralia Manis, MD  Outpatient Primary MD for the patient is HANDE,VISHWANATH, MD   LOS - 2  Subjective:   pt denies any complains, hard of hearing.    Review of Systems:   CONSTITUTIONAL: No documented fever. Positive fatigue, and weakness. No weight gain, no weight loss.  EYES: No blurry or double vision.  ENT: No tinnitus. No postnasal drip. No redness of the oropharynx.  RESPIRATORY: No cough, no wheeze, no hemoptysis. No dyspnea.  CARDIOVASCULAR: No chest pain. No orthopnea. No palpitations. No syncope.  GASTROINTESTINAL: No nausea, no vomiting or diarrhea. No abdominal pain. No melena or hematochezia.  GENITOURINARY: No dysuria or hematuria.  ENDOCRINE: No polyuria or nocturia. No heat or cold intolerance.  HEMATOLOGY: No anemia. No bruising. No bleeding.  INTEGUMENTARY: No rashes. No lesions.  MUSCULOSKELETAL: No arthritis. No swelling. No gout.  NEUROLOGIC: No numbness, tingling, or ataxia. No seizure-type activity.  PSYCHIATRIC: No anxiety. No insomnia. No ADD.    Vitals:   Vitals:   05/16/16 1940 05/17/16 0016 05/17/16 0351 05/17/16 0746  BP: 130/63 132/65 (!) 150/69 (!) 151/73  Pulse: 70 70 73 72  Resp: 16 18 16 19   Temp: 98 F (36.7 C) 97.8 F (36.6 C) 98.4 F (36.9 C) 98.3 F (36.8 C)  TempSrc: Oral Oral  Oral  SpO2: 96% 96% 98% 97%  Weight:      Height:        Wt Readings from Last 3 Encounters:  05/15/16 157 lb 14.4 oz (71.6 kg)  04/18/16 162 lb 8 oz (73.7 kg)  06/21/13 176 lb 5.9 oz (80 kg)     Intake/Output Summary (Last 24 hours) at 05/17/16 1302 Last data filed at 05/17/16 0951   Gross per 24 hour  Intake          1458.33 ml  Output              700 ml  Net           758.33 ml    Physical Exam:   GENERAL: No acute distress HEAD, EYES, EARS, NOSE AND THROAT: Atraumatic, normocephalic. Extraocular muscles are intact. Pupils equal and reactive to light. Sclerae anicteric. No conjunctival injection. No oro-pharyngeal erythema.  NECK: Supple. There is no jugular venous distention. No bruits,  no lymphadenopathy, no thyromegaly.  HEART: Regular rate and rhythm,. No murmurs, no rubs, no clicks.  LUNGS: Clear to auscultation bilaterally. No rales or rhonchi. No wheezes.  ABDOMEN: Soft, flat, nontender, nondistended. Has good bowel sounds. No hepatosplenomegaly appreciated.  EXTREMITIES: No evidence of any cyanosis, clubbing, or peripheral edema.  +2 pedal and radial pulses bilaterally.  NEUROLOGIC: The patient is alert, awake, and oriented x3 with no focal motor or sensory deficits appreciated bilaterally.  SKIN: Moist and warm with no rashes appreciated.  Psych: Not anxious, depressed LN: No inguinal LN enlargement    Antibiotics   Anti-infectives    Start     Dose/Rate Route Frequency Ordered Stop   05/16/16 1800  cefTRIAXone (ROCEPHIN) IVPB 2 g     2 g 100 mL/hr over 30 Minutes Intravenous Every 24 hours 05/16/16 0018     05/15/16 2230  cefTRIAXone (ROCEPHIN) 1 g in dextrose 5 % 50 mL IVPB  Status:  Discontinued     1 g 100 mL/hr over 30 Minutes Intravenous  Once 05/15/16 2221 05/15/16 2223   05/15/16 2230  cefTRIAXone (ROCEPHIN) IVPB 1 g     1 g 100 mL/hr over 30 Minutes Intravenous  Once 05/15/16 2224 05/15/16 2329      Medications   Scheduled Meds: . cefTRIAXone  2 g Intravenous Q24H  . heparin  5,000 Units Subcutaneous Q8H  . lisinopril  10 mg Oral Daily  . metoprolol tartrate  25 mg Oral BID  . pravastatin  20 mg Oral q1800  . sertraline  25 mg Oral Daily  . sodium chloride flush  3 mL Intravenous Q12H   Continuous Infusions: . sodium chloride  100 mL/hr at 05/17/16 0916   PRN Meds:.acetaminophen **OR** acetaminophen, ondansetron **OR** ondansetron (ZOFRAN) IV   Data Review:   Micro Results No results found for this or any previous visit (from the past 240 hour(s)).  Radiology Reports Dg Chest 1 View  Result Date: 04/21/2016 CLINICAL DATA:  Fall at home with right femoral neck fracture EXAM: CHEST 1 VIEW COMPARISON:  04/18/2016 FINDINGS: A left-sided pacemaker device with leads over the right atrium and right ventricle. No acute infiltrate or effusion. Stable enlarged cardiomediastinal none and mild prominence of the left hilum. No pneumothorax. Multiple old left-sided rib fractures. IMPRESSION: 1. No acute infiltrate or edema 2. Stable cardiomegaly Electronically Signed   By: Jasmine PangKim  Fujinaga M.D.   On: 04/21/2016 15:21   Dg Chest 1 View  Result Date: 04/18/2016 CLINICAL DATA:  Hip fracture.  [employer]ell tonight. EXAM: CHEST 1 VIEW COMPARISON:  10/01/2014 FINDINGS: There are intact appearances of the transvenous cardiac leads. The lungs are clear. There is no pneumothorax or large effusion. IMPRESSION: No acute cardiopulmonary findings. Electronically Signed   By: Ellery Plunkaniel R Mitchell M.D.   On: 04/18/2016 02:59   Ct Head Wo Contrast  Result Date: 04/25/2016 CLINICAL DATA:  Larey SeatFell tonight EXAM: CT brain without contrast CT cervical spine TECHNIQUE: Axial images of the head were obtained from skullbase to vertex without the use of intravenous contrast. Axial images of the cervical spine were obtained, with sagittal and coronal reconstructions. COMPARISON:  None FINDINGS: CT brain: There is no intracranial hemorrhage or extra-axial fluid collection. There is moderate generalized atrophy. There is hemispheric white matter hypodensity which likely represents chronic small vessel ischemic disease. Skull base and calvarium are intact. Orbits and paranasal sinuses are unremarkable. CT cervical spine: The cervical vertebrae are normal in height. The  vertebral column, pedicles and facet articulations are  intact. No evidence of acute cervical spine fracture. Moderate degenerative disc and facet changes are present. IMPRESSION: 1. Negative for acute intracranial traumatic injury. There is moderate generalized atrophy and small vessel ischemic disease. 2. Negative for acute cervical spine fracture. Electronically Signed   By: Ellery Plunk M.D.   On: 04/18/2016 02:25   Dg Chest Portable 1 View  Result Date: 05/15/2016 CLINICAL DATA:  Acute onset of generalized chest pain, shortness of breath and dyspnea. Initial encounter. EXAM: PORTABLE CHEST 1 VIEW COMPARISON:  Chest radiograph performed 04/21/2016 FINDINGS: A small left pleural effusion is noted. The lungs are otherwise grossly clear. No pneumothorax is seen. The cardiomediastinal silhouette is borderline normal in size. A pacemaker is noted overlying the left chest wall, with leads ending overlying the right atrium and right ventricle. No acute osseous abnormalities are seen. IMPRESSION: Small left pleural effusion.  Lungs otherwise grossly clear. Electronically Signed   By: Roanna Raider M.D.   On: 05/15/2016 21:15   Dg Hip Port Unilat With Pelvis 1v Right  Result Date: 04/18/2016 CLINICAL DATA:  Postop right hip fracture fixation. EXAM: DG HIP (WITH OR WITHOUT PELVIS) 1V PORT RIGHT COMPARISON:  04/18/2016 FINDINGS: There is a intra medullary gamma nail with a proximal compression screw with hip and a distal interlocking screw. Good position and alignment without complicating features. IMPRESSION: Internal fixation hardware in good position without complicating features. Electronically Signed   By: Rudie Meyer M.D.   On: 04/18/2016 18:58   Dg Hip Operative Unilat W Or W/o Pelvis Right  Result Date: 04/18/2016 CLINICAL DATA:  ORIF of right hip fracture EXAM: OPERATIVE RIGHTright HIP (WITH PELVIS IF PERFORMED) 5 VIEWS TECHNIQUE: Fluoroscopic spot image(s) were submitted for interpretation  post-operatively. COMPARISON:  04/18/2016 FINDINGS: C-arm fluoroscopic views demonstrate an intra medullary nail fixation across a be easy cervical fracture of the right femur. Fine bony detail is limited. Alignment appears near anatomic. No hardware failure is seen. IMPRESSION: New intramedullary nail fixation of right femoral neck fracture. Alignment appears near-anatomic. Electronically Signed   By: Tollie Eth M.D.   On: 04/18/2016 19:37   Dg Hip Unilat With Pelvis 2-3 Views Right  Result Date: 04/18/2016 CLINICAL DATA:  Larey Seat EXAM: DG HIP (WITH OR WITHOUT PELVIS) 2-3V RIGHT COMPARISON:  None. FINDINGS: There is a transcervical right hip fracture. No dislocation. Remote healed fracture deformities of the right pubic rami. IMPRESSION: Right hip fracture Electronically Signed   By: Ellery Plunk M.D.   On: 04/18/2016 02:58     CBC  Recent Labs Lab 05/15/16 2032 05/16/16 0457 05/17/16 0336  WBC 14.4* 10.1 7.7  HGB 14.6 13.6 13.1  HCT 42.8 39.8* 38.5*  PLT 294 237 219  MCV 88.9 89.9 90.0  MCH 30.4 30.8 30.5  MCHC 34.2 34.3 33.9  RDW 13.7 13.8 14.3  LYMPHSABS 2.0  --   --   MONOABS 0.7  --   --   EOSABS 0.0  --   --   BASOSABS 0.1  --   --     Chemistries   Recent Labs Lab 05/15/16 2032 05/16/16 0457 05/17/16 0336  NA 130* 138 138  K 6.0* 5.0 4.1  CL 101 109 108  CO2 19* 24 24  GLUCOSE 125* 98 88  BUN 39* 30* 20  CREATININE 2.79* 2.20* 1.50*  CALCIUM 8.8* 8.6* 8.4*  AST 21  --   --   ALT 17  --   --   ALKPHOS 104  --   --  BILITOT 1.2  --   --    ------------------------------------------------------------------------------------------------------------------ estimated creatinine clearance is 31.8 mL/min (by C-G formula based on SCr of 1.5 mg/dL (H)). ------------------------------------------------------------------------------------------------------------------ No results for input(s): HGBA1C in the last 72  hours. ------------------------------------------------------------------------------------------------------------------ No results for input(s): CHOL, HDL, LDLCALC, TRIG, CHOLHDL, LDLDIRECT in the last 72 hours. ------------------------------------------------------------------------------------------------------------------ No results for input(s): TSH, T4TOTAL, T3FREE, THYROIDAB in the last 72 hours.  Invalid input(s): FREET3 ------------------------------------------------------------------------------------------------------------------ No results for input(s): VITAMINB12, FOLATE, FERRITIN, TIBC, IRON, RETICCTPCT in the last 72 hours.  Coagulation profile No results for input(s): INR, PROTIME in the last 168 hours.  No results for input(s): DDIMER in the last 72 hours.  Cardiac Enzymes  Recent Labs Lab 05/15/16 2032  TROPONINI 0.04*   ------------------------------------------------------------------------------------------------------------------ Invalid input(s): POCBNP    Assessment & Plan  Patient is a 82 year old  1,  AKI (acute kidney injury) (HCC) - due to dehydration Bactrim therapy Renal function continues to improve 2. Hyperkalemia also resolved likely due to Bactrim therapy potassium this normal now  3.  UTI (urinary tract infection) -urine culture shows no growth change him to oral Ceftin 4. Acute encephalopathy likely due to combination of acute kidney injury UTI , with baseline dementia appears to be at baseline   5. Essential   HTN (hypertension) - stable, continue home meds blood pressure currently stable 6.  HLD (hyperlipidemia) - continue home meds      Code Status Orders        Start     Ordered   05/16/16 0013  Full code  Continuous     05/16/16 0012    Code Status History    Date Active Date Inactive Code Status Order ID Comments User Context   04/18/2016  5:44 AM 04/22/2016  4:05 PM Full Code [id number]  Arnaldo NatalMichael S Diamond, MD Inpatient    04/18/2016  5:43 AM 04/18/2016  5:44 AM Full Code [id number]  Juanell FairlyKevin Krasinski, MD Inpatient    Advance Directive Documentation   Flowsheet Row Most Recent Value  Type of Advance Directive  Healthcare Power of Attorney  Pre-existing out of facility DNR order (yellow form or pink MOST form)  No data  "MOST" Form in Place?  No data     Case discussed with patient's son they would like him to go to another facility      Consults  none   DVT Prophylaxis  Lovenox   Lab Results  Component Value Date   PLT 219 05/17/2016     Time Spent in minutes  32min Greater than 50% of time spent in care coordination and counseling patient regarding the condition and plan of care.   Auburn BilberryPATEL, Natoya Viscomi M.D on 05/17/2016 at 1:02 PM  Between 7am to 6pm - Pager - [phone]  After 6pm go to [email or web address] - password [email or web address] [facility]  [facility]   Office  [phone]
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
## 2016-05-18 LAB — BASIC METABOLIC PANEL
ANION GAP: 6 (ref 5–15)
BUN: 12 mg/dL (ref 6–20)
CALCIUM: 8.1 mg/dL — AB (ref 8.9–10.3)
CO2: 24 mmol/L (ref 22–32)
Chloride: 106 mmol/L (ref 101–111)
Creatinine, Ser: 1.07 mg/dL (ref 0.61–1.24)
GFR calc Af Amer: >60 mL/min (ref >60)
GFR, EST NON AFRICAN AMERICAN: 60 mL/min — AB (ref >60)
Glucose, Bld: 95 mg/dL (ref 65–99)
POTASSIUM: 3.7 mmol/L (ref 3.5–5.1)
SODIUM: 136 mmol/L (ref 135–145)

## 2016-05-18 MED ORDER — CEFUROXIME AXETIL 500 MG PO TABS: 500.0000 mg | ORAL_TABLET | Freq: Two times a day (BID) | ORAL | 0 refills | Status: AC

## 2016-05-18 MED ORDER — ACETAMINOPHEN 325 MG PO TABS: 650.0000 mg | ORAL_TABLET | Freq: Four times a day (QID) | ORAL | Status: AC | PRN

## 2016-05-18 NOTE — Discharge Summary (Signed)
[facility] - [place] at [facility], 82 y.o., DOB 07/14/1925, MRN [id number]. Admission date: 05/15/2016 Discharge Date 05/18/2016 Primary MD Barbette Reichmann, MD Admitting Physician Oralia Manis, MD  Admission Diagnosis  Hyperkalemia [E87.5] Acute renal failure, unspecified acute renal failure type (HCC) [N17.9] Altered mental status, unspecified altered mental status type [R41.82]  Discharge Diagnosis   Principal Problem:   AKI (acute kidney injury) (HCC)   Acute cystitis Acute encephalopathy   Hyperkalemia   HTN (hypertension)   HLD (hyperlipidemia) Dehydration Familial combined hyperlipidemia Normocytic hypochromic anemia     Hospital Course Nathaniel Fisher  is a 82 y.o. male who presents with Confusion, poor by mouth intake. Patient had hip fracture surgery several weeks ago and since then has been in a nursing facility for rehabilitation. There is some report that he had some possible aspiration event prior to being brought to the ED. patient in the ER was noted to have acute kidney injury was noted to have hyperkalemia. He was recently treated with Bactrim for UTI. Bactrim likely causes hyperkalemia and acute renal failure. Patient was given IV fluids with normalization of his renal function his mental status is much improved he does have dementia and is currently at baseline. Family has chosen to take him to another facility.      Antibiotics duration Ceftin 500 one tab by mouth twice a day 4 days  Strongly recommend patient be encouraged to drink plenty of fluids and assist with his meals       Consults  None  Significant Tests:  See full reports for all details     Dg Chest 1 View  Result Date: 04/21/2016 CLINICAL DATA:  Fall at home with right femoral neck fracture EXAM: CHEST 1 VIEW COMPARISON:  04/18/2016 FINDINGS: A left-sided pacemaker device with leads over the right atrium and right ventricle. No acute infiltrate or  effusion. Stable enlarged cardiomediastinal none and mild prominence of the left hilum. No pneumothorax. Multiple old left-sided rib fractures. IMPRESSION: 1. No acute infiltrate or edema 2. Stable cardiomegaly Electronically Signed   By: Jasmine Pang M.D.   On: 04/21/2016 15:21   Dg Chest Portable 1 View  Result Date: 05/15/2016 CLINICAL DATA:  Acute onset of generalized chest pain, shortness of breath and dyspnea. Initial encounter. EXAM: PORTABLE CHEST 1 VIEW COMPARISON:  Chest radiograph performed 04/21/2016 FINDINGS: A small left pleural effusion is noted. The lungs are otherwise grossly clear. No pneumothorax is seen. The cardiomediastinal silhouette is borderline normal in size. A pacemaker is noted overlying the left chest wall, with leads ending overlying the right atrium and right ventricle. No acute osseous abnormalities are seen. IMPRESSION: Small left pleural effusion.  Lungs otherwise grossly clear. Electronically Signed   By: Roanna Raider M.D.   On: 05/15/2016 21:15   Dg Hip Port Unilat With Pelvis 1v Right  Result Date: 04/18/2016 CLINICAL DATA:  Postop right hip fracture fixation. EXAM: DG HIP (WITH OR WITHOUT PELVIS) 1V PORT RIGHT COMPARISON:  04/18/2016 FINDINGS: There is a intra medullary gamma nail with a proximal compression screw with hip and a distal interlocking screw. Good position and alignment without complicating features. IMPRESSION: Internal fixation hardware in good position without complicating features. Electronically Signed   By: Rudie Meyer M.D.   On: 04/18/2016 18:58   Dg Hip Operative Unilat W Or W/o Pelvis Right  Result Date: 04/18/2016 CLINICAL DATA:  ORIF of right hip fracture EXAM: OPERATIVE RIGHTright HIP (WITH PELVIS IF PERFORMED) 5 VIEWS TECHNIQUE:  Fluoroscopic spot image(s) were submitted for interpretation post-operatively. COMPARISON:  04/18/2016 FINDINGS: C-arm fluoroscopic views demonstrate an intra medullary nail fixation across a be easy cervical  fracture of the right femur. Fine bony detail is limited. Alignment appears near anatomic. No hardware failure is seen. IMPRESSION: New intramedullary nail fixation of right femoral neck fracture. Alignment appears near-anatomic. Electronically Signed   By: Tollie Ethavid  Kwon M.D.   On: 04/18/2016 19:37       Today   Subjective:   Nathaniel Fisher  patient confused and unable to provide any review of systems no distress noted  Objective:   Blood pressure (!) 148/64, pulse 72, temperature 97.8 F (36.6 C), temperature source Oral, resp. rate 18, height 5\' 7"  (1.702 m), weight 157 lb 14.4 oz (71.6 kg), SpO2 98 %.  .  Intake/Output Summary (Last 24 hours) at 05/18/16 1053 Last data filed at 05/18/16 1022  Gross per 24 hour  Intake             3435 ml  Output              625 ml  Net             2810 ml    Exam VITAL SIGNS: Blood pressure (!) 148/64, pulse 72, temperature 97.8 F (36.6 C), temperature source Oral, resp. rate 18, height 5\' 7"  (1.702 m), weight 157 lb 14.4 oz (71.6 kg), SpO2 98 %.  GENERAL:  82 y.o.-year-old patient lying in the bed with no acute distress.  EYES: Pupils equal, round, reactive to light and accommodation. No scleral icterus. Extraocular muscles intact.  HEENT: Head atraumatic, normocephalic. Oropharynx and nasopharynx clear.  NECK:  Supple, no jugular venous distention. No thyroid enlargement, no tenderness.  LUNGS: Normal breath sounds bilaterally, no wheezing, rales,rhonchi or crepitation. No use of accessory muscles of respiration.  CARDIOVASCULAR: S1, S2 normal. No murmurs, rubs, or gallops.  ABDOMEN: Soft, nontender, nondistended. Bowel sounds present. No organomegaly or mass.  EXTREMITIES: No pedal edema, cyanosis, or clubbing.  NEUROLOGIC: Cranial nerves II through XII are intact. Muscle strength 5/5 in all extremities. Sensation intact. Gait not checked.  PSYCHIATRIC: The patient is alert and oriented x 3.  SKIN: No obvious rash, lesion, or ulcer.    Data Review     CBC w Diff: Lab Results  Component Value Date   WBC 7.7 05/17/2016   HGB 13.1 05/17/2016   HGB 13.5 10/04/2014   HCT 38.5 (L) 05/17/2016   HCT 40.7 10/04/2014   PLT 219 05/17/2016   PLT 150 10/04/2014   LYMPHOPCT 14 05/15/2016   LYMPHOPCT 14.0 10/04/2014   MONOPCT 5 05/15/2016   MONOPCT 10.7 10/04/2014   EOSPCT 0 05/15/2016   EOSPCT 0.1 10/04/2014   BASOPCT 1 05/15/2016   BASOPCT 0.5 10/04/2014   CMP: Lab Results  Component Value Date   NA 136 05/18/2016   NA 135 10/04/2014   K 3.7 05/18/2016   K 3.5 10/04/2014   CL 106 05/18/2016   CL 102 10/04/2014   CO2 24 05/18/2016   CO2 22 10/04/2014   BUN 12 05/18/2016   BUN 11 10/04/2014   CREATININE 1.07 05/18/2016   CREATININE 0.79 10/04/2014   PROT 6.5 05/15/2016   PROT 6.4 06/19/2013   ALBUMIN 3.7 05/15/2016   ALBUMIN 3.7 06/19/2013   BILITOT 1.2 05/15/2016   BILITOT 0.4 06/19/2013   ALKPHOS 104 05/15/2016   ALKPHOS 82 06/19/2013   AST 21 05/15/2016   AST 33 06/19/2013   ALT  17 05/15/2016   ALT 26 06/19/2013  .  Micro Results Recent Results (from the past 240 hour(s))  Urine culture     Status: Abnormal   Collection Time: 05/15/16  8:32 PM  Result Value Ref Range Status   Specimen Description URINE, CATHETERIZED  Final   Special Requests NONE  Final   Culture (A)  Final    <10,000 COLONIES/mL INSIGNIFICANT GROWTH Performed at [facility]    Report Status 05/17/2016 FINAL  Final        Code Status Orders        Start     Ordered   05/16/16 0013  Full code  Continuous     05/16/16 0012    Code Status History    Date Active Date Inactive Code Status Order ID Comments User Context   04/18/2016  5:44 AM 04/22/2016  4:05 PM Full Code [id number]  Arnaldo NatalMichael S Diamond, MD Inpatient   04/18/2016  5:43 AM 04/18/2016  5:44 AM Full Code [id number]  Juanell FairlyKevin Krasinski, MD Inpatient    Advance Directive Documentation   Flowsheet Row Most Recent Value  Type of Advance Directive  Healthcare  Power of Attorney  Pre-existing out of facility DNR order (yellow form or pink MOST form)  No data  "MOST" Form in Place?  No data           Contact information for follow-up providers    HANDE,VISHWANATH, MD Follow up in 1 week(s).   Specialty:  Internal Medicine Contact information: [address] [facility] [place] [id number] [phone]            Contact information for after-discharge care    Destination    [facility] SNF[facility] Follow up.   Specialties:  Skilled Holiday [facility]ursing Facility, Assisted Living Facility Contact information: [address] [place] [phone]                  Discharge Medications     Medication List    STOP taking these medications   oxyCODONE 5 MG immediate release tablet Commonly known as:  Oxy IR/ROXICODONE   sulfamethoxazole-trimethoprim 800-160 MG tablet Commonly known as:  BACTRIM DS,SEPTRA DS     TAKE these medications   acetaminophen 325 MG tablet Commonly known as:  TYLENOL Take 2 tablets (650 mg total) by mouth every 6 (six) hours as needed for mild pain (or Fever >/= 101).   cefUROXime 500 MG tablet Commonly known as:  CEFTIN Take 1 tablet (500 mg total) by mouth 2 (two) times daily with a meal.   ferrous sulfate 325 (65 FE) MG tablet Take 325 mg by mouth daily with breakfast.   lisinopril 10 MG tablet Commonly known as:  PRINIVIL,ZESTRIL TAKE 1 TABLET BY MOUTH EVERY DAY   lovastatin 20 MG tablet Commonly known as:  MEVACOR Take 20 mg by mouth at bedtime.   metoprolol tartrate 25 MG tablet Commonly known as:  LOPRESSOR Take 1 tablet (25 mg total) by mouth 2 (two) times daily.   potassium chloride SA 20 MEQ tablet Commonly known as:  K-DUR,KLOR-CON Take 20 mEq by mouth daily.   sertraline 25 MG tablet Commonly known as:  ZOLOFT Take 25 mg by mouth daily.          Total Time in preparing paper work, data evaluation and todays exam -  35 minutes  Auburn BilberryPATEL, Rondal Vandevelde M.D on 05/18/2016 at 10:53 AM  [facility]   Office  [phone]

## 2016-05-18 NOTE — Progress Notes (Signed)
Physical Therapy Treatment Patient Details Name: Nathaniel Fisher MRN: [id number] DOB: Oct 16, 1925 Today's Date: 05/18/2016    History of Present Illness Pt is a 82 y/o M admitted from SNF with AMS, chest pain, acute renal failure.  Of note, recent s/p fall at home and R femoral neck fx, s/p IM nail R hip.  D/c to SNF following.  Pt's PMH includes stroke, hernia repair, permanent pacemaker insertion.    PT Comments    Pt initially refusing but agrees with min encouragement.  Pt was able to stand x 2 at bedside with min a x 1 for mobility but refused ambulation.  He did participate in some standing exercises as described below but had some increased agitation with requests.  While pt reported minimal pain in RLE he stated several times that he had recently fractured it and to "Give me a break"  Anticipate discharge to rehab today.   Follow Up Recommendations  SNF     Equipment Recommendations       Recommendations for Other Services       Precautions / Restrictions Restrictions Weight Bearing Restrictions: No    Mobility  Bed Mobility Overal bed mobility: Needs Assistance Bed Mobility: Supine to Sit;Sit to Supine     Supine to sit: Min assist Sit to supine: Min assist      Transfers Overall transfer level: Needs assistance Equipment used: Rolling walker (2 wheeled) Transfers: Sit to/from Stand Sit to Stand: Min assist         General transfer comment: assist to stand but once up able to maintain balance.  Ambulation/Gait             General Gait Details: refused. reporting he broke his leg recently and "stop pushing me"     Stairs            Wheelchair Mobility    Modified Rankin (Stroke Patients Only)       Balance Overall balance assessment: Needs assistance Sitting-balance support: Feet supported Sitting balance-Leahy Scale: Fair Sitting balance - Comments: no lob's today in sitting   Standing balance support: Bilateral upper extremity  supported Standing balance-Leahy Scale: Fair                      Cognition Arousal/Alertness: Awake/alert Behavior During Therapy: Agitated Overall Cognitive Status: History of cognitive impairments - at baseline                      Exercises Other Exercises Other Exercises: participated in minimal RLE AROM in standing but became increasingly agitated with requests for activity    General Comments        Pertinent Vitals/Pain Pain Assessment: Faces Faces Pain Scale: Hurts little more Pain Descriptors / Indicators: Sore Pain Intervention(s): Limited activity within patient's tolerance    Home Living                      Prior Function            PT Goals (current goals can now be found in the care plan section) Progress towards PT goals: Progressing toward goals    Frequency    Min 2X/week      PT Plan Current plan remains appropriate    Co-evaluation             End of Session Equipment Utilized During Treatment: Gait belt Activity Tolerance: Other (comment) Patient left: in bed;with bed alarm set;with call  bell/phone within reach     Time: 1105-1121 PT Time Calculation (min) (ACUTE ONLY): 16 min  Charges:  $Therapeutic Exercise: 8-22 mins                    G Codes:      Danielle DessSarah Emillee Talsma 05/18/2016, 12:35 PM

## 2016-05-18 NOTE — Progress Notes (Signed)
CH rounding the unit visited the Pt. Pt was confused, incognizant, and was not responding conversation. CH left and reported the same to the Nurse.

## 2016-05-18 NOTE — Progress Notes (Addendum)
Patient is medically stable for D/C to [facility] today. [facility] to room E[facility]12. RN will call report and arrange EMS for transport. Clinical Child psychotherapistocial Worker (CSW) sent [facility]C orders to [facility] via [employer]. CSW contacted patient's daughter Lucendia HerrlichFaye and made her aware of above. Per Lucendia HerrlichFaye [facility] now completing [facility] paper work. Please reconsult if future social work needs arise. CSW signing off.   Patient's son Brett CanalesSteve is also aware of above and in agreement with plan.   Baker Hughes IncorporatedBailey Dak Szumski, LCSW [phone]

## 2016-05-18 NOTE — Progress Notes (Signed)
On assessment patient alert and oriented to self. Patient's creatinine 1.07 today. Patient not complaining of any pain. Patient has diminished lung sounds and normal heart sounds. Patient has permanent pacemaker. Patient takes his meds whole. RN has to remove pocketed food from his mouth this morning and supervised the rest of his meal. Patient hard of hearing.   Patient on dysphagia 3 diet.  Report called to [facility]. Report given to Ermalinda BarriosElizabeth Kelley. EMS called for transport.  Harvie HeckMelanie Shaheen Star, RN

## 2016-05-18 NOTE — Clinical Social Work Placement (Signed)
   CLINICAL SOCIAL WORK PLACEMENT  NOTE  Date:  05/18/2016  Patient Details  Name: Nathaniel Fisher MRN: [id number] Date of Birth: [birth year]  Clinical Social Work is seeking post-discharge placement for this patient at the Skilled  Nursing Facility level of care (*CSW will initial, date and re-position this form in  chart as items are completed):  Yes   Patient/family provided with [facility] Clinical Social Work Department's list of facilities offering this level of care within the geographic area requested by the patient (or if unable, by the patient's family).  Yes   Patient/family informed of their freedom to choose among providers that offer the needed level of care, that participate in Medicare, Medicaid or managed care program needed by the patient, have an available bed and are willing to accept the patient.  Yes   Patient/family informed of [facility]'s ownership interest in [facility] and [facility], as well as of the fact that they are under no obligation to receive care at these facilities.  PASRR submitted to EDS on       PASRR number received on       Existing PASRR number confirmed on 05/17/16     FL2 transmitted to all facilities in geographic area requested by pt/family on 05/17/16     FL2 transmitted to all facilities within larger geographic area on       Patient informed that his/her managed care company has contracts with or will negotiate with certain facilities, including the following:        Yes   Patient/family informed of bed offers received.  Patient [facility] bed at  [facility] )     Physician recommends and patient chooses bed at      Patient to be transferred to  [facility] ) on 05/18/16.  Patient to be transferred to facility by  [facility] )     Patient family notified on 05/18/16 of transfer.  Name of family member notified:   (Patient's daughter Nathaniel Fisher is aware of D/C today. )     PHYSICIAN       Additional Comment:     _______________________________________________ Alaster Asfaw, Darleen CrockerBailey M, LCSW 05/18/2016, 11:30 AM

## 2016-05-18 NOTE — Discharge Instructions (Signed)
[facility] [place] [facility]  DIET:  dyspagia 3 diet with thin liquids  DISCHARGE CONDITION:  Stable  ACTIVITY:  Activity as tolerated  OXYGEN:  Home Oxygen: No.   Oxygen Delivery: room air  DISCHARGE LOCATION:  nursing home    ADDITIONAL DISCHARGE INSTRUCTION: encourage po intake fluid intake   If you experience worsening of your admission symptoms, develop shortness of breath, life threatening emergency, suicidal or homicidal thoughts you must seek medical attention immediately by calling 911 or calling your MD immediately  if symptoms less severe.  You Must read complete instructions/literature along with all the possible adverse reactions/side effects for all the Medicines you take and that have been prescribed to you. Take any new Medicines after you have completely understood and accpet all the possible adverse reactions/side effects.   Please note  You were cared for by a hospitalist during your hospital stay. If you have any questions about your discharge medications or the care you received while you were in the hospital after you are discharged, you can call the unit and asked to speak with the hospitalist on call if the hospitalist that took care of you is not available. Once you are discharged, your primary care physician will handle any further medical issues. Please note that NO REFILLS for any discharge medications will be authorized once you are discharged, as it is imperative that you return to your primary care physician (or establish a relationship with a primary care physician if you do not have one) for your aftercare needs so that they can reassess your need for medications and monitor your lab values.

## 2016-07-15 DEATH — deceased

## 2018-06-06 IMAGING — CR DG HIP (WITH PELVIS) OPERATIVE*R*
1 series · 5 of 5 positions shown · non-contrast
Comparison: 04/18/2016

CLINICAL DATA: ORIF of right hip fracture

EXAM:
OPERATIVE RIGHTright HIP (WITH PELVIS IF PERFORMED) 5 VIEWS
TECHNIQUE: Fluoroscopic spot image(s) were submitted for interpretation
post-operatively.

[Series 6001: dr (person_name)1 · 5 of 5 slices shown]
[im 1/5]
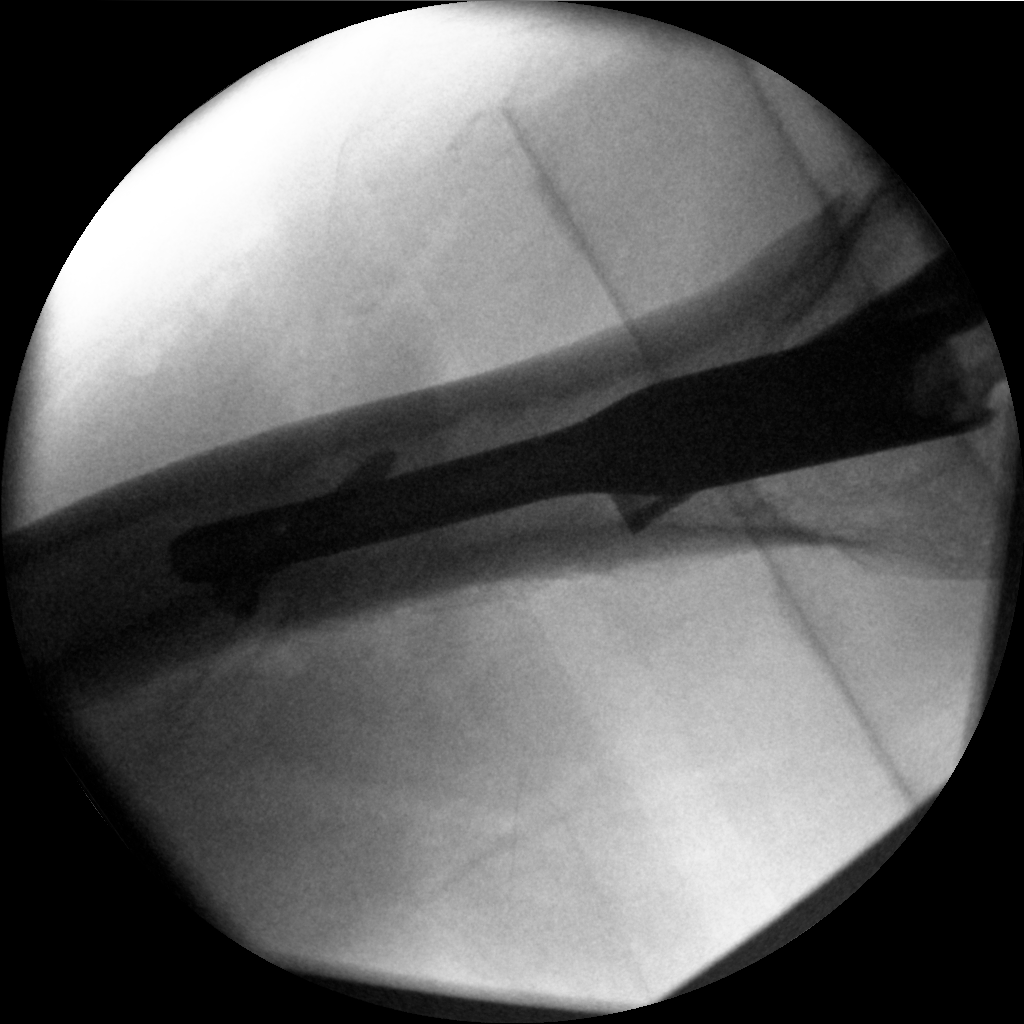
[im 2/5]
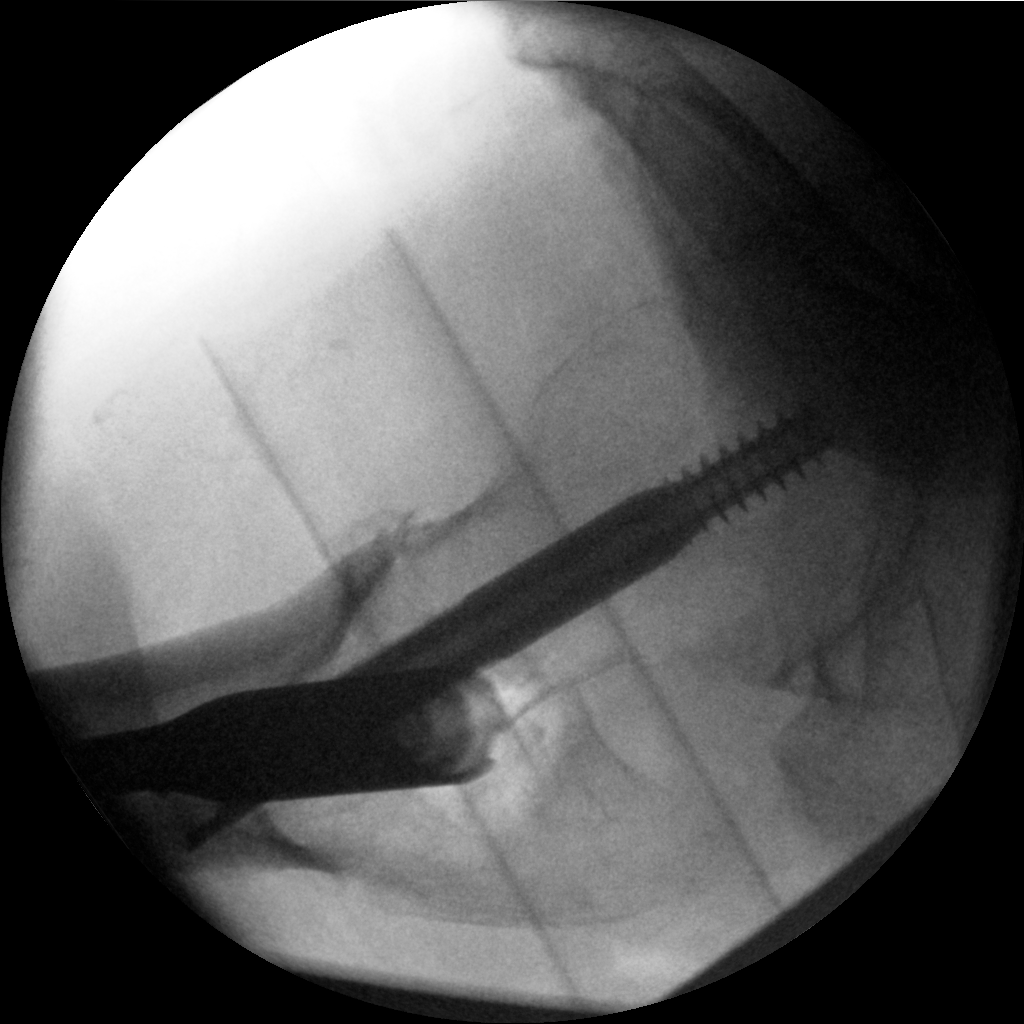
[im 3/5]
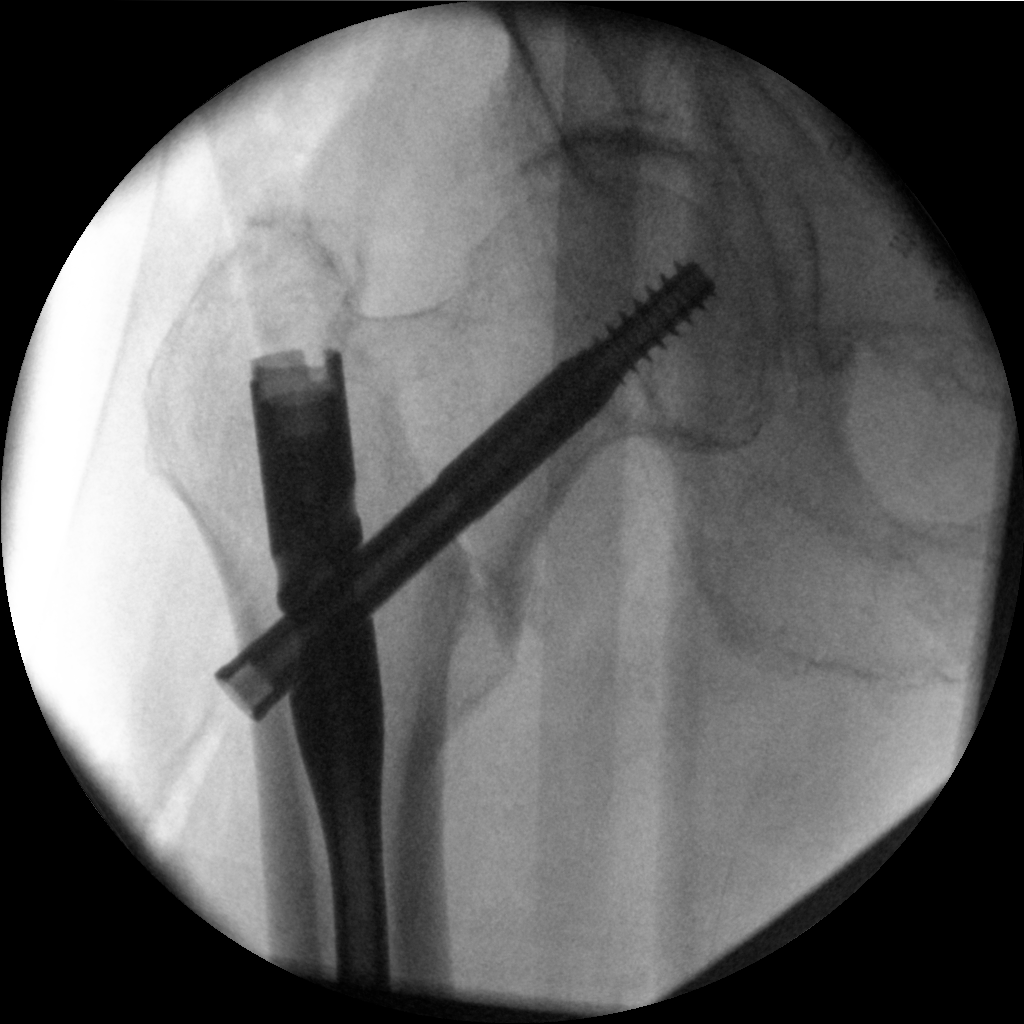
[im 4/5]
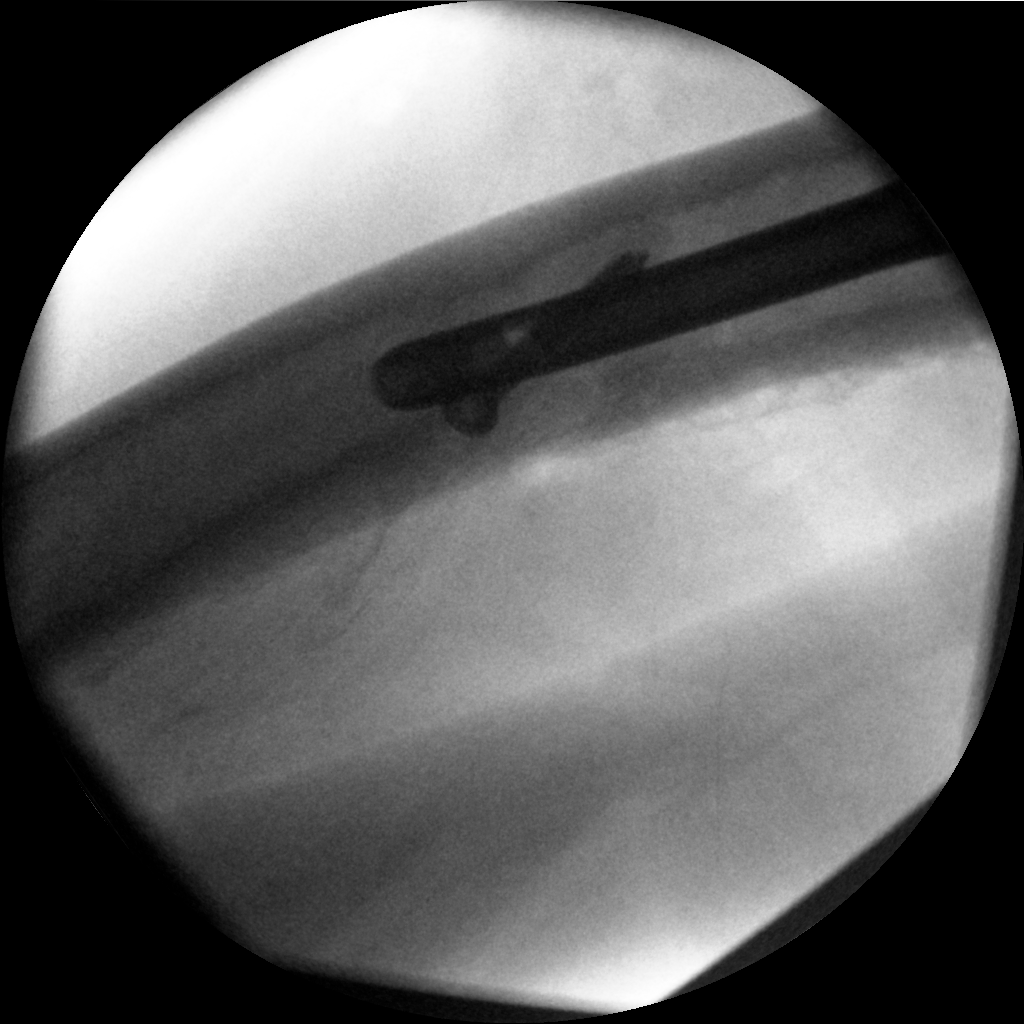
[im 5/5]
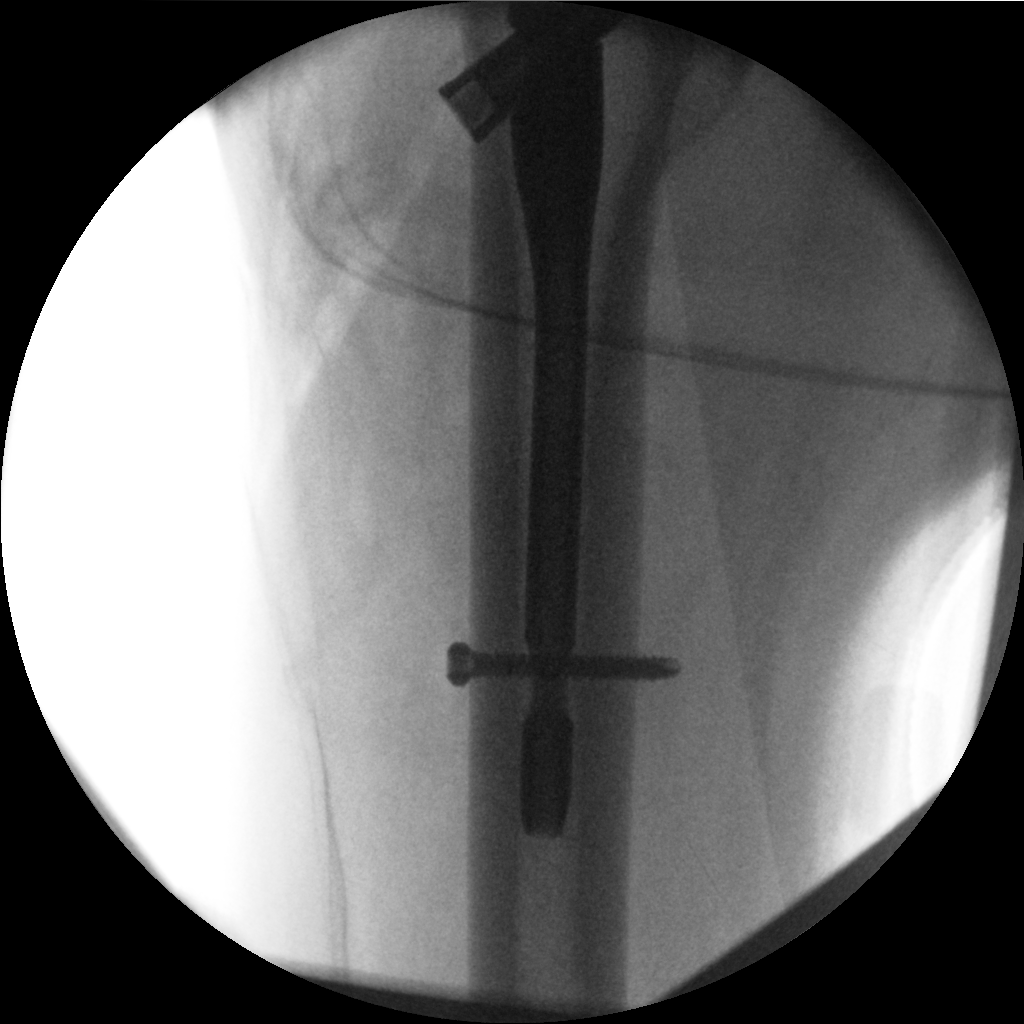

[5 of 5 positions shown; findings below may reference images not displayed]

FINDINGS: C-arm fluoroscopic views demonstrate an intra medullary nail
fixation across a be easy cervical fracture of the right femur. Fine
bony detail is limited. Alignment appears near anatomic. No hardware
failure is seen.
IMPRESSION: New intramedullary nail fixation of right femoral neck fracture.
Alignment appears near-anatomic.

## 2018-06-06 IMAGING — CR DG HIP (WITH OR WITHOUT PELVIS) 2-3V*R*
1 series · 4 of 4 positions shown · non-contrast
Comparison: None.

CLINICAL DATA: Fell

EXAM:
DG HIP (WITH OR WITHOUT PELVIS) 2-3V RIGHT

[Series 1: dg hip unilat w or w/o pelvis 2-3 views  · non-contrast · 0.14mm/px · 4 of 4 slices shown]
[im 1/4]
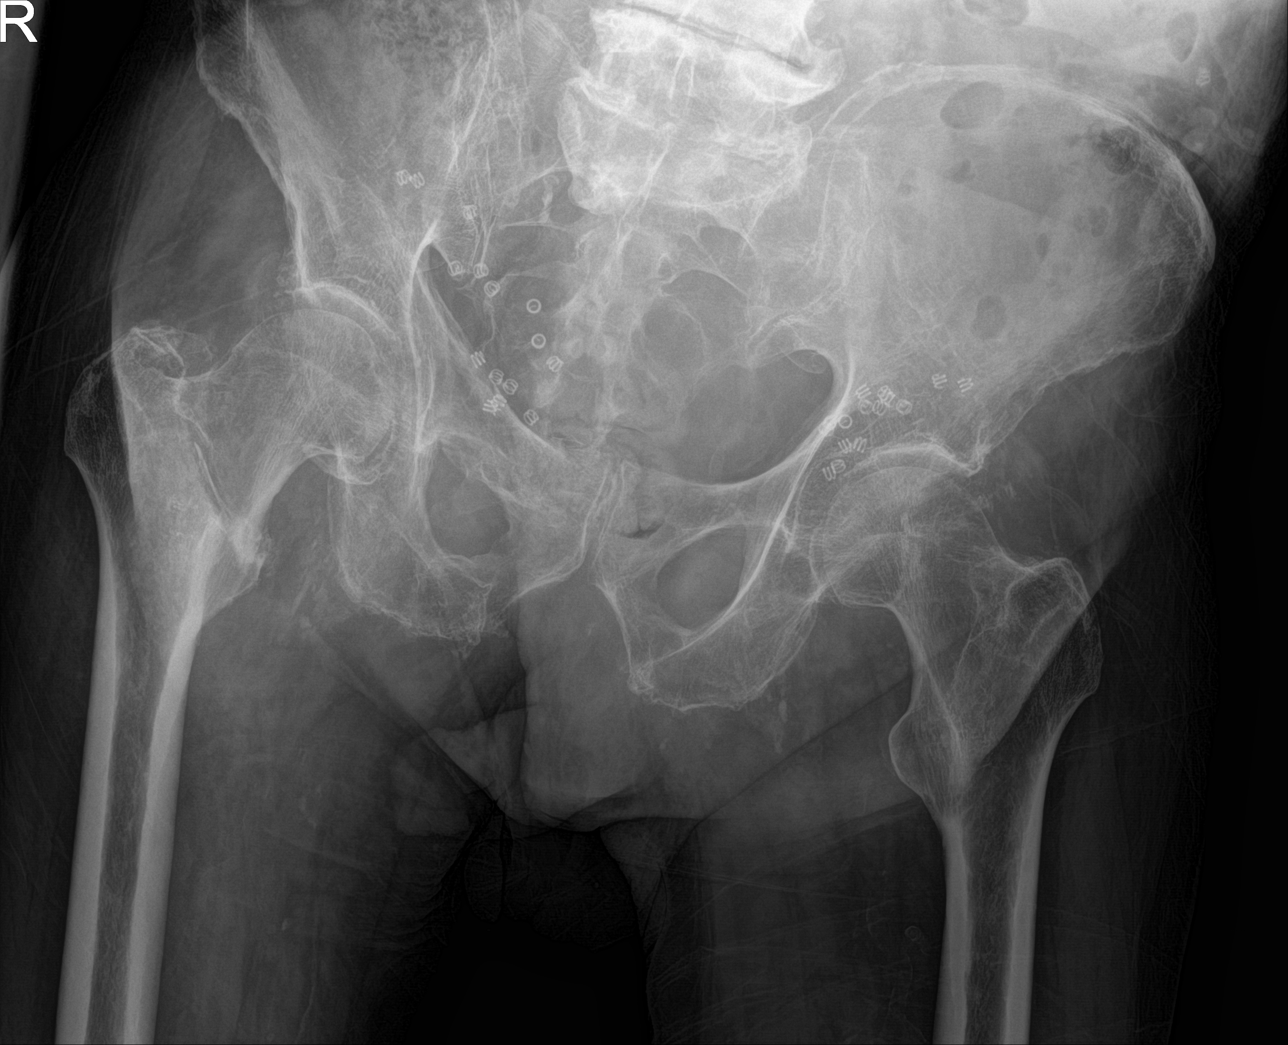
[im 2/4]
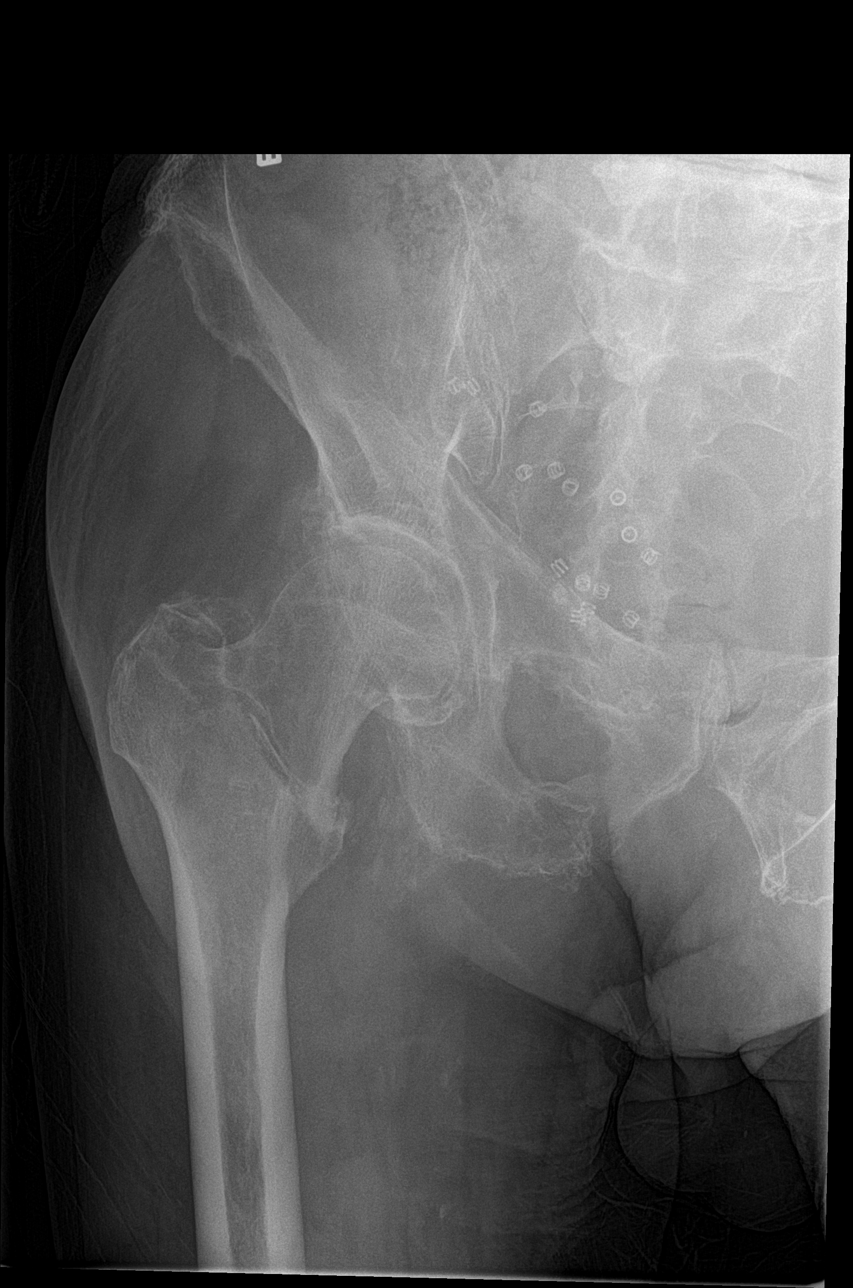
[im 3/4]
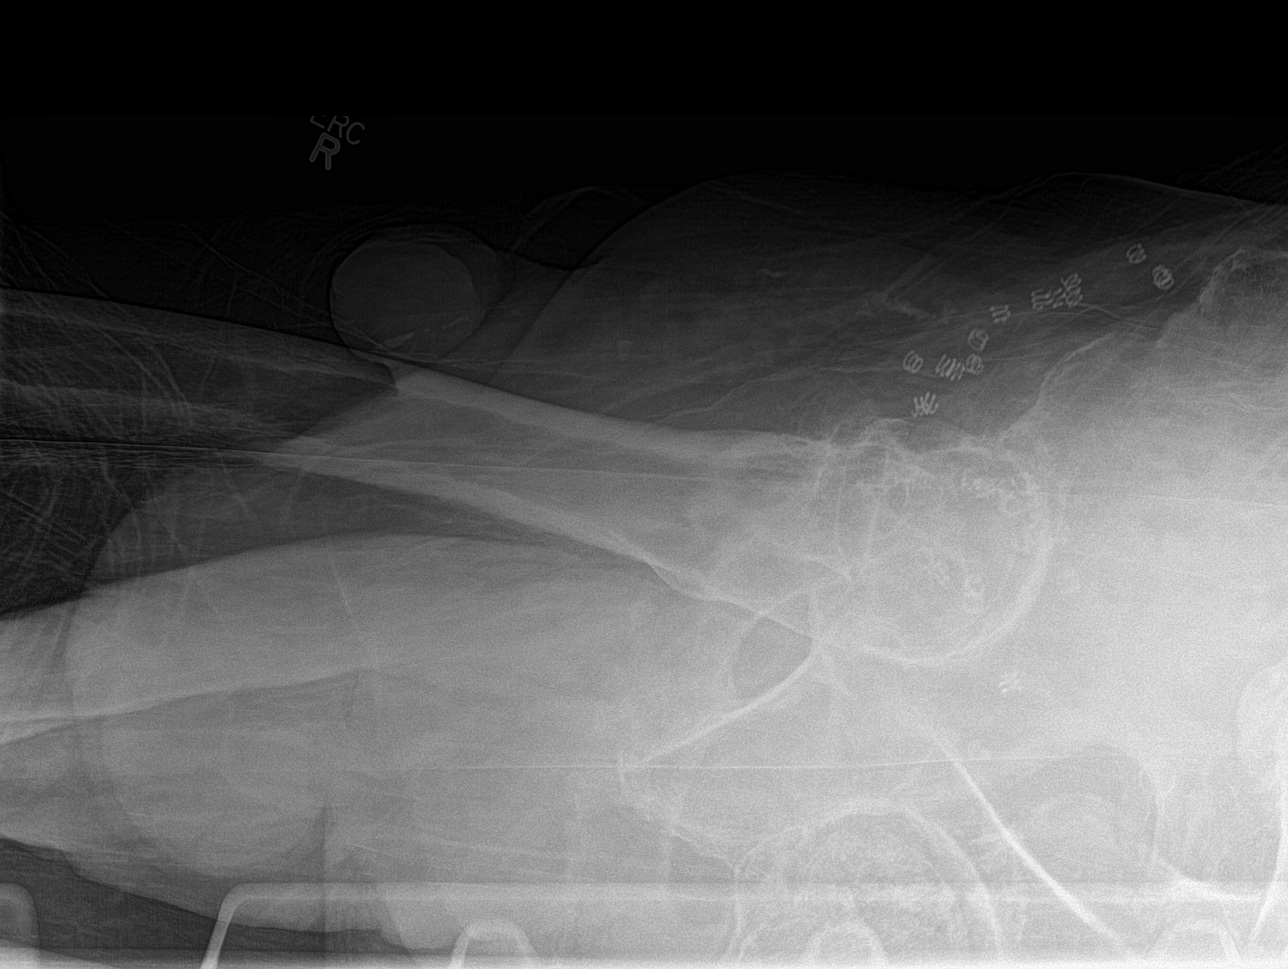
[im 4/4]
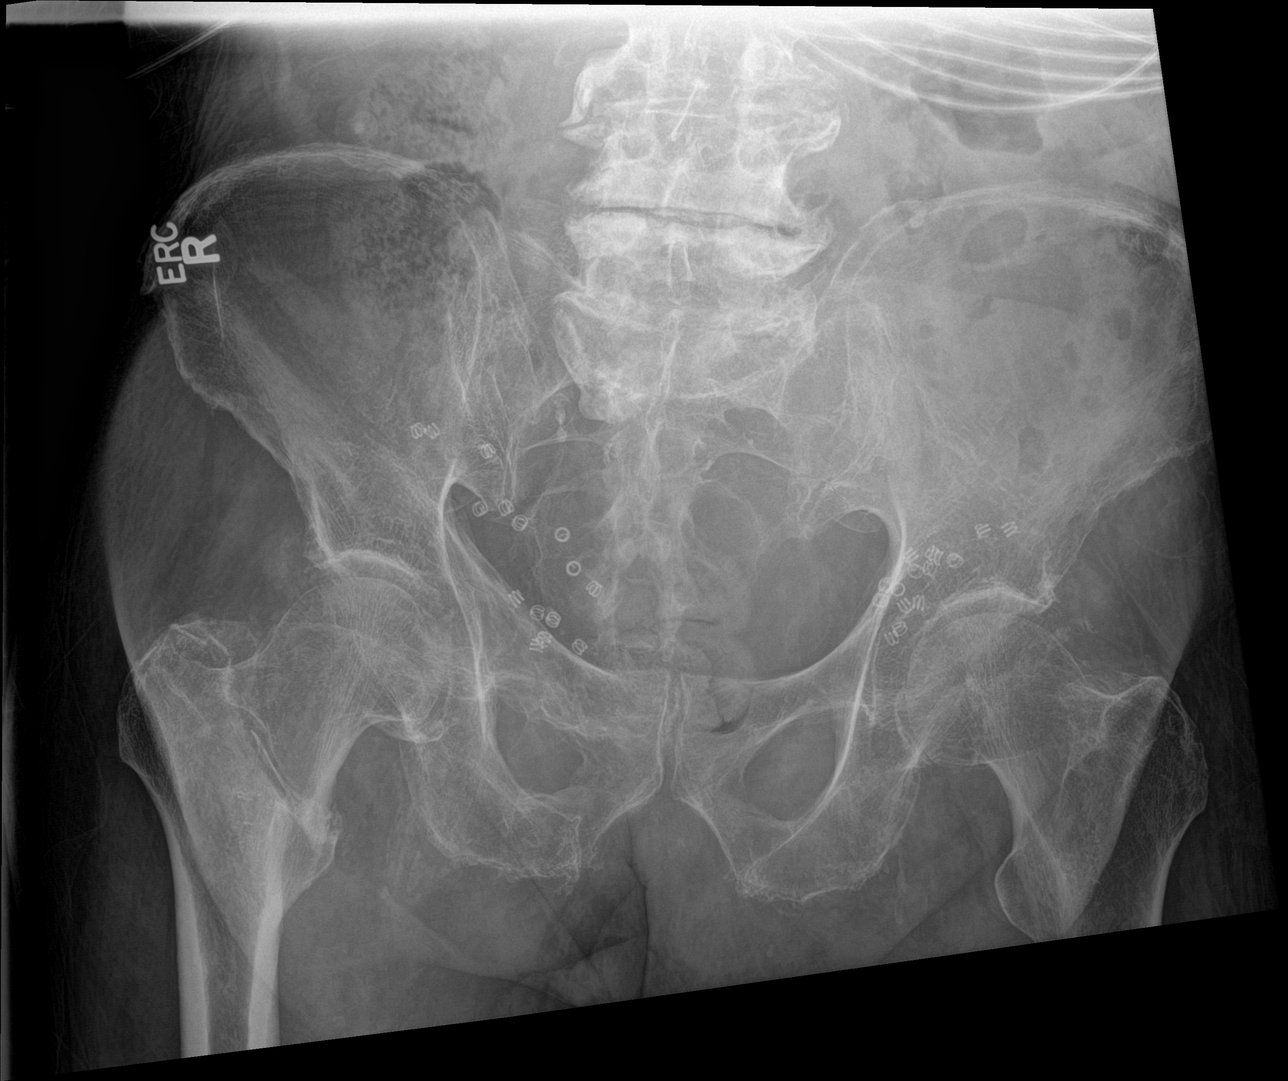

[4 of 4 positions shown; findings below may reference images not displayed]

FINDINGS: There is a transcervical right hip fracture. No dislocation. Remote
healed fracture deformities of the right pubic rami.
IMPRESSION: Right hip fracture

## 2018-07-03 IMAGING — DX DG CHEST 1V PORT
1 series · 1 of 1 positions shown · non-contrast
Comparison: Chest radiograph performed 04/21/2016

CLINICAL DATA: Acute onset of generalized chest pain, shortness of
breath and dyspnea. Initial encounter.

EXAM:
PORTABLE CHEST 1 VIEW

[chest ap]
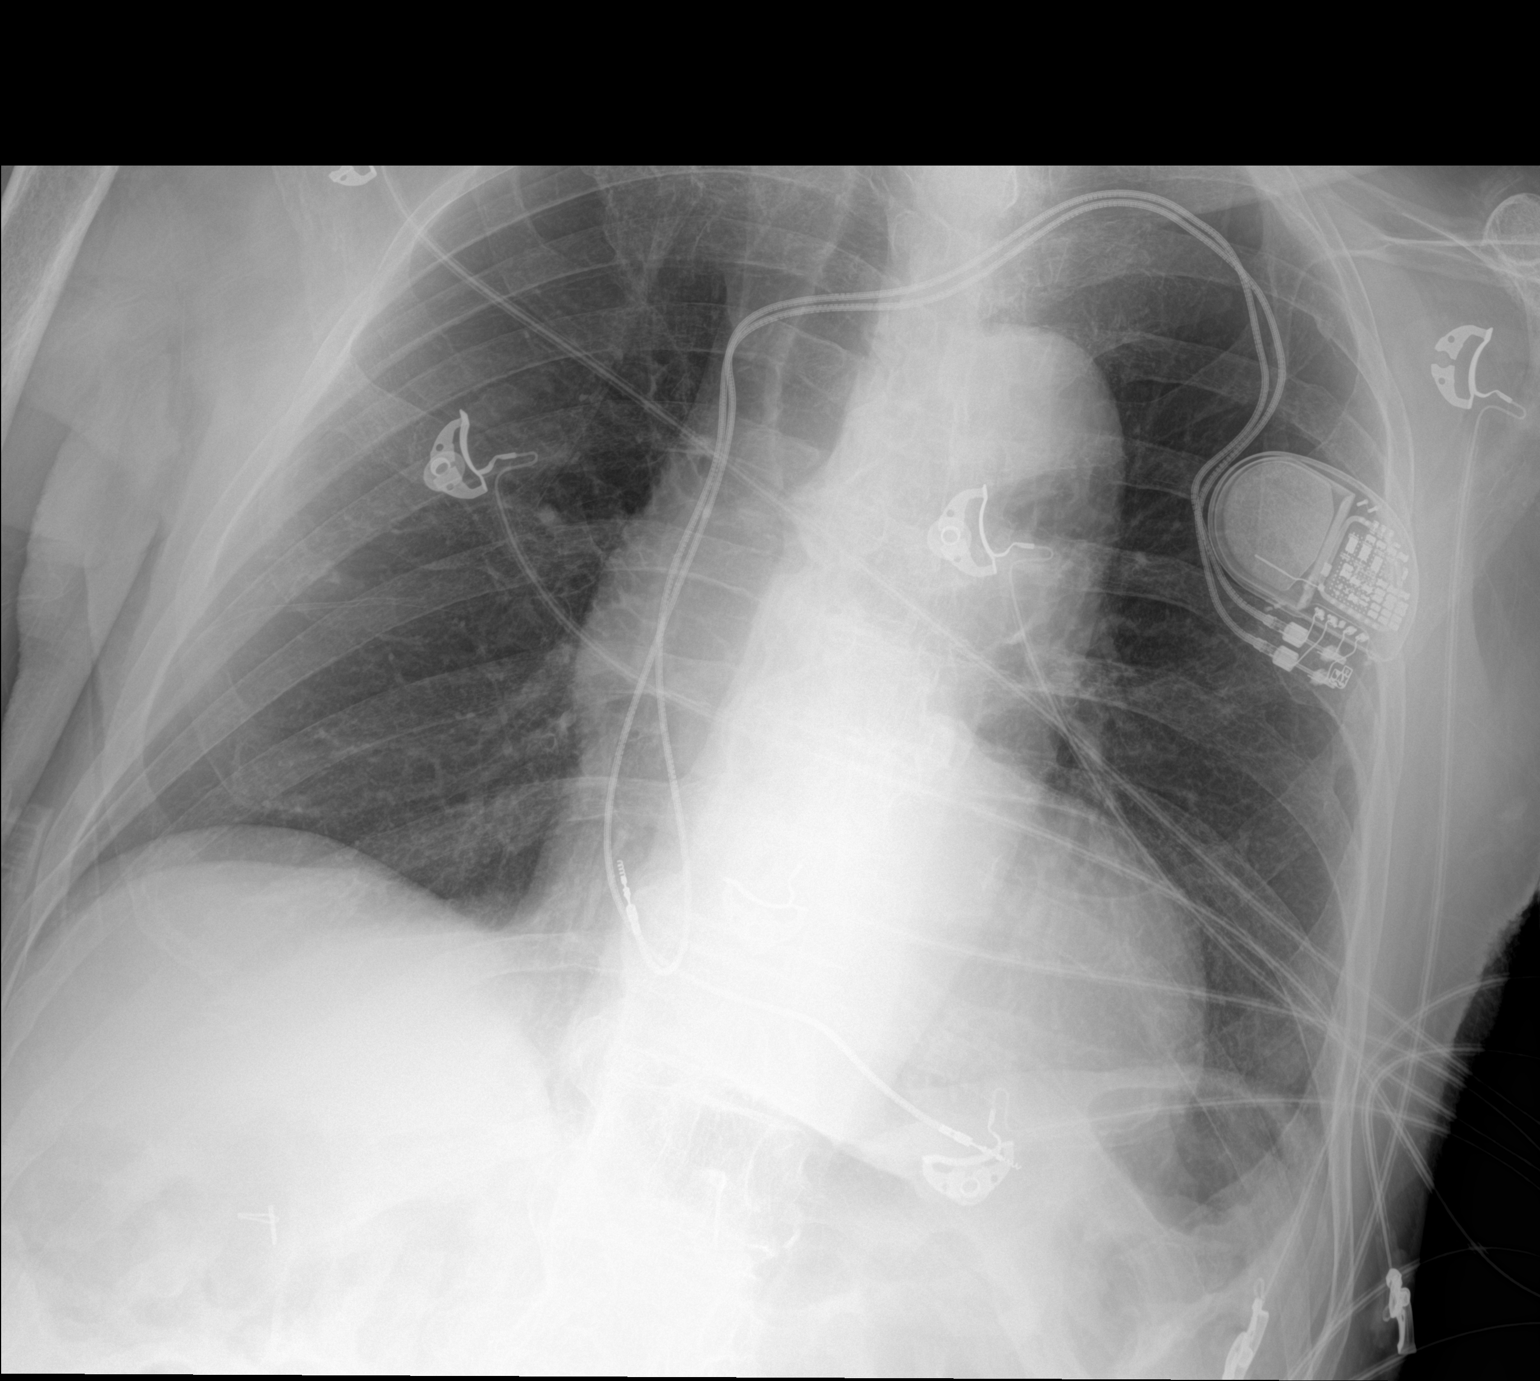

[1 of 1 positions shown; findings below may reference images not displayed]

FINDINGS: A small left pleural effusion is noted. The lungs are otherwise
grossly clear. No pneumothorax is seen.

The cardiomediastinal silhouette is borderline normal in size. A
pacemaker is noted overlying the left chest wall, with leads ending
overlying the right atrium and right ventricle. No acute osseous
abnormalities are seen.
IMPRESSION: Small left pleural effusion.  Lungs otherwise grossly clear.
